# Patient Record
Sex: Female | Born: 1955 | Race: White | Hispanic: No | Marital: Married | Smoking: Current every day smoker
Health system: Southern US, Community
[De-identification: ages and names within clinical notes are randomized; demographics above are authoritative.]

## PROBLEM LIST (undated history)

## (undated) DIAGNOSIS — I2510 Atherosclerotic heart disease of native coronary artery without angina pectoris: Secondary | ICD-10-CM

## (undated) DIAGNOSIS — N12 Tubulo-interstitial nephritis, not specified as acute or chronic: Secondary | ICD-10-CM

## (undated) DIAGNOSIS — J449 Chronic obstructive pulmonary disease, unspecified: Secondary | ICD-10-CM

## (undated) HISTORY — PX: CORONARY ARTERY BYPASS GRAFT: SHX141

## (undated) HISTORY — PX: ABDOMINAL HYSTERECTOMY: SHX81

---

## 2012-07-20 ENCOUNTER — Encounter (HOSPITAL_COMMUNITY): Payer: Self-pay

## 2012-07-20 ENCOUNTER — Emergency Department (HOSPITAL_COMMUNITY)
Admission: EM | Admit: 2012-07-20 | Discharge: 2012-07-20 | Disposition: A | Discharge status: Home / Self Care | Payer: Medicare HMO | Attending: Emergency Medicine | Admitting: Emergency Medicine

## 2012-07-20 DIAGNOSIS — F17200 Nicotine dependence, unspecified, uncomplicated: Secondary | ICD-10-CM | POA: Insufficient documentation

## 2012-07-20 DIAGNOSIS — F419 Anxiety disorder, unspecified: Secondary | ICD-10-CM

## 2012-07-20 DIAGNOSIS — F323 Major depressive disorder, single episode, severe with psychotic features: Secondary | ICD-10-CM

## 2012-07-20 DIAGNOSIS — F411 Generalized anxiety disorder: Secondary | ICD-10-CM

## 2012-07-20 DIAGNOSIS — N12 Tubulo-interstitial nephritis, not specified as acute or chronic: Secondary | ICD-10-CM | POA: Insufficient documentation

## 2012-07-20 DIAGNOSIS — F329 Major depressive disorder, single episode, unspecified: Secondary | ICD-10-CM

## 2012-07-20 DIAGNOSIS — Z951 Presence of aortocoronary bypass graft: Secondary | ICD-10-CM | POA: Insufficient documentation

## 2012-07-20 DIAGNOSIS — Z8739 Personal history of other diseases of the musculoskeletal system and connective tissue: Secondary | ICD-10-CM | POA: Insufficient documentation

## 2012-07-20 DIAGNOSIS — I2510 Atherosclerotic heart disease of native coronary artery without angina pectoris: Secondary | ICD-10-CM | POA: Insufficient documentation

## 2012-07-20 DIAGNOSIS — Z87448 Personal history of other diseases of urinary system: Secondary | ICD-10-CM | POA: Insufficient documentation

## 2012-07-20 HISTORY — DX: Atherosclerotic heart disease of native coronary artery without angina pectoris: I25.10

## 2012-07-20 HISTORY — DX: Tubulo-interstitial nephritis, not specified as acute or chronic: N12

## 2012-07-20 LAB — URINALYSIS, ROUTINE W REFLEX MICROSCOPIC
Bilirubin Urine: NEGATIVE
Glucose, UA: NEGATIVE mg/dL
Ketones, ur: NEGATIVE mg/dL
Nitrite: NEGATIVE
Protein, ur: NEGATIVE mg/dL

## 2012-07-20 LAB — COMPREHENSIVE METABOLIC PANEL
ALT: 29 U/L (ref 0–35)
AST: 24 U/L (ref 0–37)
Albumin: 3.8 g/dL (ref 3.5–5.2)
Alkaline Phosphatase: 98 U/L (ref 39–117)
Potassium: 4.2 mEq/L (ref 3.5–5.1)
Sodium: 138 mEq/L (ref 135–145)
Total Protein: 7.3 g/dL (ref 6.0–8.3)

## 2012-07-20 LAB — RAPID URINE DRUG SCREEN, HOSP PERFORMED
Amphetamines: NOT DETECTED
Barbiturates: NOT DETECTED
Benzodiazepines: POSITIVE — AB
Cocaine: NOT DETECTED

## 2012-07-20 LAB — CBC WITH DIFFERENTIAL/PLATELET
Basophils Absolute: 0 10*3/uL (ref 0.0–0.1)
Basophils Relative: 1 % (ref 0–1)
Eosinophils Absolute: 0.1 10*3/uL (ref 0.0–0.7)
Lymphs Abs: 1.6 10*3/uL (ref 0.7–4.0)
MCH: 29 pg (ref 26.0–34.0)
MCHC: 32.7 g/dL (ref 30.0–36.0)
Neutrophils Relative %: 63 % (ref 43–77)
Platelets: 210 10*3/uL (ref 150–400)
RBC: 4.34 MIL/uL (ref 3.87–5.11)
RDW: 14.2 % (ref 11.5–15.5)

## 2012-07-20 MED ORDER — NICOTINE 21 MG/24HR TD PT24
21.0000 mg | MEDICATED_PATCH | Freq: Every day | TRANSDERMAL | Status: DC
  Administered 2012-07-20: 21 mg via TRANSDERMAL
  Filled 2012-07-20: qty 1

## 2012-07-20 MED ORDER — ALPRAZOLAM 0.5 MG PO TABS: 0.5000 mg | ORAL_TABLET | Freq: Three times a day (TID) | ORAL | Status: DC | PRN

## 2012-07-20 MED ORDER — ONDANSETRON HCL 4 MG PO TABS: 4.0000 mg | ORAL_TABLET | Freq: Three times a day (TID) | ORAL | Status: DC | PRN

## 2012-07-20 MED ORDER — ATORVASTATIN CALCIUM 40 MG PO TABS
40.0000 mg | ORAL_TABLET | Freq: Every day | ORAL | Status: DC
  Filled 2012-07-20: qty 1

## 2012-07-20 MED ORDER — HYDROCODONE-ACETAMINOPHEN 10-325 MG PO TABS: 1.0000 | ORAL_TABLET | Freq: Four times a day (QID) | ORAL | Status: DC | PRN

## 2012-07-20 MED ORDER — ALUM & MAG HYDROXIDE-SIMETH 200-200-20 MG/5ML PO SUSP: 30.0000 mL | ORAL | Status: DC | PRN

## 2012-07-20 MED ORDER — IBUPROFEN 600 MG PO TABS: 600.0000 mg | ORAL_TABLET | Freq: Three times a day (TID) | ORAL | Status: DC | PRN

## 2012-07-20 MED ORDER — ZOLPIDEM TARTRATE 10 MG PO TABS: 10.0000 mg | ORAL_TABLET | Freq: Every evening | ORAL | Status: DC | PRN

## 2012-07-20 MED ORDER — ALPRAZOLAM 1 MG PO TABS: 2.0000 mg | ORAL_TABLET | Freq: Three times a day (TID) | ORAL | Status: DC | PRN

## 2012-07-20 MED ORDER — ACETAMINOPHEN 325 MG PO TABS
650.0000 mg | ORAL_TABLET | ORAL | Status: DC | PRN
  Administered 2012-07-20: 650 mg via ORAL
  Filled 2012-07-20: qty 2

## 2012-07-20 MED ORDER — ZOLPIDEM TARTRATE 5 MG PO TABS: 5.0000 mg | ORAL_TABLET | Freq: Every evening | ORAL | Status: DC | PRN

## 2012-07-20 NOTE — BH Assessment (Signed)
[facility] Assessment Progress Note     Contacted Damoni Erker who is the husband.  His number is [phone].  He is en route to pick up pt.  Dr. Elsie Saas and Derwood Kaplan Ranking evaluated pt and will rescind paper work of IVC.  Pt will be recommended to follow up with primary care and psychiatric services in optx.  Husband confirmed pt has no psych admit hx and is not a danger to self or others based on hx.   Referral to optx resources provided.    Husband will pick up pt approximately 1900 when he gets in from [place].
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
## 2012-07-20 NOTE — BH Assessment (Signed)
[facility] Assessment Progress Note    Recommend ED get a Tele Psych ordered on pt and have her evaluated.  If needed, ACT will become involved for placement or follow up purposes.

## 2012-07-20 NOTE — ED Notes (Signed)
Her husband has phoned to tell us he is safe and sound and is at work as a Naval architect in [place].  He states he will attempt to call and speak with her in a couple of hours.  Her neighbor, Benjamin Stain has just phone to give her phone number [phone]) and to tell us she would be happy to give her a ride home.  She remains in no distress.

## 2012-07-20 NOTE — ED Notes (Signed)
telepsych info faxed and called 

## 2012-07-20 NOTE — ED Notes (Signed)
IVC papers faxed/magistrate called

## 2012-07-20 NOTE — ED Notes (Signed)
She has eaten breakfast without difficulty and remains in no distress.  She abruptly announced that she needed to leave, however, Dr. Lynelle Doctor determined that she needed to stay here for her safety. I.V.C. Papers were signed; and pt. Was then escorted (ambulatory) to psych. E.D. After I phoned report to [place], [place]

## 2012-07-20 NOTE — ED Notes (Signed)
Bed:WA21<BR> Expected date:<BR> Expected time:<BR> Means of arrival:<BR> Comments:<BR> EMS

## 2012-10-16 ENCOUNTER — Observation Stay (HOSPITAL_COMMUNITY)
Admission: EM | Admit: 2012-10-16 | Discharge: 2012-10-18 | Disposition: A | Discharge status: Home / Self Care | Payer: Medicare HMO | Attending: Internal Medicine | Admitting: Internal Medicine

## 2012-10-16 ENCOUNTER — Emergency Department (HOSPITAL_COMMUNITY): Payer: Medicare HMO

## 2012-10-16 ENCOUNTER — Encounter (HOSPITAL_COMMUNITY): Payer: Self-pay | Admitting: Neurology

## 2012-10-16 DIAGNOSIS — R4781 Slurred speech: Secondary | ICD-10-CM

## 2012-10-16 DIAGNOSIS — I6509 Occlusion and stenosis of unspecified vertebral artery: Secondary | ICD-10-CM | POA: Insufficient documentation

## 2012-10-16 DIAGNOSIS — J4489 Other specified chronic obstructive pulmonary disease: Secondary | ICD-10-CM | POA: Insufficient documentation

## 2012-10-16 DIAGNOSIS — W1809XA Striking against other object with subsequent fall, initial encounter: Secondary | ICD-10-CM | POA: Insufficient documentation

## 2012-10-16 DIAGNOSIS — R29898 Other symptoms and signs involving the musculoskeletal system: Secondary | ICD-10-CM | POA: Insufficient documentation

## 2012-10-16 DIAGNOSIS — W1800XA Striking against unspecified object with subsequent fall, initial encounter: Secondary | ICD-10-CM

## 2012-10-16 DIAGNOSIS — I771 Stricture of artery: Secondary | ICD-10-CM | POA: Insufficient documentation

## 2012-10-16 DIAGNOSIS — R4789 Other speech disturbances: Secondary | ICD-10-CM | POA: Insufficient documentation

## 2012-10-16 DIAGNOSIS — Z8673 Personal history of transient ischemic attack (TIA), and cerebral infarction without residual deficits: Secondary | ICD-10-CM | POA: Insufficient documentation

## 2012-10-16 DIAGNOSIS — R569 Unspecified convulsions: Secondary | ICD-10-CM

## 2012-10-16 DIAGNOSIS — Z8679 Personal history of other diseases of the circulatory system: Secondary | ICD-10-CM

## 2012-10-16 DIAGNOSIS — J449 Chronic obstructive pulmonary disease, unspecified: Secondary | ICD-10-CM | POA: Insufficient documentation

## 2012-10-16 DIAGNOSIS — R55 Syncope and collapse: Principal | ICD-10-CM | POA: Insufficient documentation

## 2012-10-16 LAB — POCT I-STAT, CHEM 8
Calcium, Ion: 1.12 mmol/L (ref 1.12–1.23)
Creatinine, Ser: 0.8 mg/dL (ref 0.50–1.10)
Glucose, Bld: 120 mg/dL — ABNORMAL HIGH (ref 70–99)
HCT: 40 % (ref 36.0–46.0)
Hemoglobin: 13.6 g/dL (ref 12.0–15.0)
Potassium: 3.9 mEq/L (ref 3.5–5.1)

## 2012-10-16 LAB — URINALYSIS, ROUTINE W REFLEX MICROSCOPIC
Bilirubin Urine: NEGATIVE
Hgb urine dipstick: NEGATIVE
Ketones, ur: NEGATIVE mg/dL
Nitrite: NEGATIVE
Protein, ur: NEGATIVE mg/dL
Specific Gravity, Urine: 1.016 (ref 1.005–1.030)
Urobilinogen, UA: 0.2 mg/dL (ref 0.0–1.0)

## 2012-10-16 LAB — RAPID URINE DRUG SCREEN, HOSP PERFORMED
Amphetamines: NOT DETECTED
Barbiturates: NOT DETECTED
Benzodiazepines: NOT DETECTED
Cocaine: NOT DETECTED
Opiates: NOT DETECTED
Tetrahydrocannabinol: NOT DETECTED

## 2012-10-16 LAB — COMPREHENSIVE METABOLIC PANEL
ALT: 30 U/L (ref 0–35)
AST: 23 U/L (ref 0–37)
Albumin: 3.8 g/dL (ref 3.5–5.2)
CO2: 20 mEq/L (ref 19–32)
Calcium: 9.4 mg/dL (ref 8.4–10.5)
Creatinine, Ser: 0.8 mg/dL (ref 0.50–1.10)
GFR calc non Af Amer: 79 mL/min — ABNORMAL LOW (ref >90)
Sodium: 133 mEq/L — ABNORMAL LOW (ref 135–145)
Total Protein: 7.4 g/dL (ref 6.0–8.3)

## 2012-10-16 LAB — CBC
MCHC: 33.6 g/dL (ref 30.0–36.0)
MCV: 90.4 fL (ref 78.0–100.0)
Platelets: 210 10*3/uL (ref 150–400)
RDW: 14.7 % (ref 11.5–15.5)
WBC: 7.5 10*3/uL (ref 4.0–10.5)

## 2012-10-16 LAB — POCT I-STAT TROPONIN I: Troponin i, poc: 0 ng/mL (ref 0.00–0.08)

## 2012-10-16 LAB — APTT: aPTT: 28 seconds (ref 24–37)

## 2012-10-16 LAB — DIFFERENTIAL
Basophils Absolute: 0 10*3/uL (ref 0.0–0.1)
Basophils Relative: 0 % (ref 0–1)
Eosinophils Relative: 3 % (ref 0–5)
Lymphocytes Relative: 20 % (ref 12–46)
Monocytes Absolute: 0.4 10*3/uL (ref 0.1–1.0)

## 2012-10-16 LAB — ETHANOL: Alcohol, Ethyl (B): <11 mg/dL (ref 0–11)

## 2012-10-16 LAB — PROTIME-INR: Prothrombin Time: 13 seconds (ref 11.6–15.2)

## 2012-10-16 MED ORDER — GADOBENATE DIMEGLUMINE 529 MG/ML IV SOLN
15.0000 mL | Freq: Once | INTRAVENOUS | Status: AC
  Administered 2012-10-16: 15 mL via INTRAVENOUS

## 2012-10-16 MED ORDER — ASPIRIN EC 81 MG PO TBEC
81.0000 mg | DELAYED_RELEASE_TABLET | Freq: Every day | ORAL | Status: DC
  Administered 2012-10-16 – 2012-10-18 (×3): 81 mg via ORAL
  Filled 2012-10-16 (×4): qty 1

## 2012-10-16 MED ORDER — ALPRAZOLAM 0.5 MG PO TABS: 0.5000 mg | ORAL_TABLET | Freq: Two times a day (BID) | ORAL | Status: DC | PRN

## 2012-10-16 MED ORDER — HEPARIN SODIUM (PORCINE) 5000 UNIT/ML IJ SOLN
5000.0000 [IU] | Freq: Three times a day (TID) | INTRAMUSCULAR | Status: DC
  Administered 2012-10-16 – 2012-10-18 (×5): 5000 [IU] via SUBCUTANEOUS
  Filled 2012-10-16 (×8): qty 1

## 2012-10-16 MED ORDER — VITAMIN B-1 100 MG PO TABS
100.0000 mg | ORAL_TABLET | Freq: Every day | ORAL | Status: DC
  Administered 2012-10-17 – 2012-10-18 (×2): 100 mg via ORAL
  Filled 2012-10-16 (×2): qty 1

## 2012-10-16 MED ORDER — ONDANSETRON HCL 4 MG PO TABS
4.0000 mg | ORAL_TABLET | Freq: Once | ORAL | Status: AC
  Administered 2012-10-16: 4 mg via ORAL
  Filled 2012-10-16: qty 1

## 2012-10-16 MED ORDER — SODIUM CHLORIDE 0.9 % IJ SOLN
3.0000 mL | Freq: Two times a day (BID) | INTRAMUSCULAR | Status: DC
  Administered 2012-10-16 – 2012-10-17 (×3): 3 mL via INTRAVENOUS

## 2012-10-16 MED ORDER — NICOTINE 14 MG/24HR TD PT24
14.0000 mg | MEDICATED_PATCH | TRANSDERMAL | Status: DC
  Administered 2012-10-16 – 2012-10-17 (×2): 14 mg via TRANSDERMAL
  Filled 2012-10-16 (×3): qty 1

## 2012-10-16 MED ORDER — ACETAMINOPHEN 500 MG PO TABS
500.0000 mg | ORAL_TABLET | Freq: Four times a day (QID) | ORAL | Status: DC | PRN
  Administered 2012-10-16 – 2012-10-17 (×3): 500 mg via ORAL
  Filled 2012-10-16 (×3): qty 1

## 2012-10-16 MED ORDER — FOLIC ACID 5 MG/ML IJ SOLN
1.0000 mg | Freq: Every day | INTRAMUSCULAR | Status: DC
  Administered 2012-10-16 – 2012-10-17 (×2): 1 mg via INTRAVENOUS
  Filled 2012-10-16 (×2): qty 0.2

## 2012-10-16 MED ORDER — ADULT MULTIVITAMIN W/MINERALS CH
1.0000 | ORAL_TABLET | Freq: Every day | ORAL | Status: DC
  Administered 2012-10-16 – 2012-10-18 (×3): 1 via ORAL
  Filled 2012-10-16 (×4): qty 1

## 2012-10-16 NOTE — Consult Note (Addendum)
Neurology Consultation Reason for Consult: Syncope Referring Physician: Rancour, S.  CC: Loss of consciousness  History is obtained from: EMS, patient  HPI: Ann [name] is a 57 y.o. female who was apparently last seen well about 8 AM and at that time she was her normal self per her husband via EMS. She then collapsed against the dresser, and EMS was called. On arrival, EMS noted some left-sided weakness and slurred speech and therefore a code stroke was activated. On arrival, her symptoms were rapidly improving. CT shows cerebellar infarct.  Of note, she has a history of "irregular heartbeat", but does not see a primary care physician here and has been off of her blood thinners for the past year.  EMS reports that she did have incontinence of urine. She also complains of headache.  LKW: 8 AM tpa given: no, mild symptoms    ROS: A 14 point ROS was performed and is negative except as noted in the HPI.  Past Medical History  Diagnosis Date  . Coronary artery disease   . Pyelonephritis   . Pyelonephritis 1    Family History: No hx similar  Social History: Tob: +smoker  Exam: Current vital signs: BP 119/51  Pulse 96  Resp 18  SpO2 95% Vital signs in last 24 hours: Pulse Rate:  [96] 96 (09/04 0920) Resp:  [18] 18 (09/04 0920) BP: (119)/(51) 119/51 mmHg (09/04 0920) SpO2:  [90 %-95 %] 95 % (09/04 0922)  General: In bed, NAD CV: Regular rate and Mental Status: Patient is awake, alert, oriented to person, place, month, year, and situation. She has some impaired memory of this morning in the events of last night Patient is able to give a clear and coherent history. No signs of aphasia or neglect Cranial Nerves: II: Visual Fields are full. Pupils are equal, round, and reactive to light.  Discs are without papilledema. III,IV, VI: EOMI without ptosis or diploplia.  V: Facial sensation is symmetric to temperature VII: Facial movement is symmetric.  VIII: hearing is intact  to voice X: Uvula elevates symmetrically XI: Shoulder shrug is symmetric. XII: tongue is midline without atrophy or fasciculations.  Motor: Tone is normal. Bulk is normal. 5/5 strength was present on the right, on the left she initially did display some hesitancy in movement, but to confrontation she appeared to have full strength and did not have any drift. Sensory: Sensation is symmetric to light touch and pin in the arms and legs. Deep Tendon Reflexes: 2+ and symmetric in the biceps and patellae.  Plantars: Toes are downgoing bilaterally.  Cerebellar: FNF and HKS are intact bilaterally, though initially she had some difficulty on her left with concentration she was able to perform it well. Gait: Slightly unsteady, but is able to walk   I have reviewed labs in epic and the results pertinent to this consultation are: chem 8-unremarkable  I have reviewed the images obtained: CT head cerebellar stroke  Impression: 57 year old female with sudden onset loss of consciousness followed by left-sided weakness. Given her cerebellar infarct, there may be concerned for posterior circulation TIA but I'm also concerned that this possibly could have been a seizure. An MRI could be helpful it shows ischemia or a clear seizure focus.  Recommendations: 1) MRI brain 2) will need to get further history from the husband once she arrives, my attempts to contact him so far have failed.  Ritta Slot, MD [facility] [phone]  If 7pm- 7am, please page neurology on call at [phone].  Spoke with husband, patient had no complete LOC, but became lightheaded and fel into dresser. He was not sure of focal weakness. Started talking more after about 2-3 minutes and then progressively improved.   Syncope of unclear etiology.  1) Will order EEG   Ritta Slot, MD [facility] [phone]  If 7pm- 7am, please page neurology on call at [phone].

## 2012-10-16 NOTE — ED Notes (Signed)
Admitting MD at bedside.

## 2012-10-16 NOTE — ED Notes (Signed)
Pt was brought back here after chest xray, still needs MRI.

## 2012-10-16 NOTE — ED Notes (Signed)
Called to order patient a regular meal tray.

## 2012-10-16 NOTE — ED Notes (Signed)
Report called to tracy on [address]

## 2012-10-16 NOTE — ED Notes (Signed)
Updated the patient on inpatient room assignment delay. Will move the patient to POD C to await for bed. Patient verbalized understanding and agrees with plan. Care transferred and report given to [place], [place]

## 2012-10-16 NOTE — Progress Notes (Signed)
Spoke with Neurology Dr. Amada Jupiter who would like to add MRA head WO contrast and MRA neck W/WO contrast. I have discussed with the ED physician about above recs from Neurology.   Dede Query, MD PGY-3 IMTS 10:24 AM

## 2012-10-16 NOTE — ED Notes (Signed)
Pt in MRI will not obtain VS.

## 2012-10-16 NOTE — ED Notes (Signed)
Pt reporting h/a , paged admitting MD for pain med order.

## 2012-10-16 NOTE — H&P (Signed)
Date: 10/16/2012               Patient Name:  Ann [name] MRN: [id number]  DOB: 02/04/1956 Age / Sex: 58 y.o., female   PCP: Pcp Not In System         Medical Service: Internal Medicine Teaching Service         Attending Physician: Dr. Rocco Serene, MD    First Contact: Dr. Vivi Barrack Pager: [phone]  Second Contact: Dr. Lennice Sites Pager: [phone]       After Hours (After 5p/  First Contact Pager: [phone]  weekends / holidays): Second Contact Pager: [phone]   Chief Complaint: Fall and slurred speech  History of Present Illness:  Mrs. Ann [name] is a 57 y.o. woman PMH COPD, benign cardiac tumor, anxiety disorder who presents with history of a fall and slurred speech.  The patient was in her normal state of health until 8am this morning, making coffee in the kitchen, when she collapsed. Her husband was with her and able to catch her so that she did not hit her head. She did however fall onto a piece of furniture, hitting the left side of her chest. The husband states she was responsive to him the whole time, so he does not think she lost consciousness. However that patient herself remembers nothing of the event, nor the EMS ride into the hospital. Patient and husband deny aura, palpitations, chest pain, diaphoresis, confusion before she fell. After she fell, no bowel or bladder incontinence. No jerking movements of arms and legs. No tongue biting. Per first responders she had left sided weakness, disorientation and dysarthria at the scene. On the ride in, EMS reported normal grip strength, but persistent disorientation and dysarthria, though improving. Currently the patient is alert and oriented and denies weakness, numbness, dysarthria, word-finding difficulties. This has never happened to her before.  She is on multiple sedating medications including Xanax and Norco. She says she takes 2-3 Xanax daily, amitriptyline about once a week at night "when I feel like it". She states she has a  prescription for Norco but only takes it very rarely, with no use in the past few weeks. She brings 3 medicine bottles with her. One is for Xanax 1mg  TID, #90, dispensed on 09/26/12, with zero pills left. Another is for amitriptyline 50 qd, the dispense date was in 2013. She also brings a bottle of Norco 7.5-325 once daily prn, #30, with ~10 pills left, dispense date in April of this year.   We ran an inquiry on her into the narcotic database: - On 09/26/12 she filled xanax 1mg  #90 and oxycodone-acetaminophen 10-325 #30.  - On 09/01/12 she filled xanax 2mg  #90 and hydrocodone-acetaminophen 10-325 #30.  - On 08/04/12 she filled xanax 2mg  #90 and hydrocodone-acetaminophen 10-325 #30.  - On 07/06/12 she filled xanax 2mg  #90 and hydrocodone-acetaminophen 10-325 #30.   She gets her medications from Dr. Allegra Lai in [place], [place], [phone]. Her husband owns a trucking company and they split their time between [place]. They travel a lot. She and her husband just returned from a road trip last night, getting in at 3am.  She has smoked 1PPD since she was a girl. Denies alcohol or drug use.   Meds: Current Facility-Administered Medications  Medication Dose Route Frequency Provider Last Rate Last Dose  . acetaminophen (TYLENOL) tablet 500 mg  500 mg Oral Q6H PRN Judie Bonus, MD   500 mg at 10/16/12 1430  Current Outpatient Prescriptions  Medication Sig Dispense Refill  . acetaminophen (TYLENOL) 500 MG tablet Take 500 mg by mouth every 6 (six) hours as needed for pain.      Marland Kitchen ALPRAZolam (XANAX) 1 MG tablet Take 1 mg by mouth daily as needed for sleep (anxiety).      Marland Kitchen atorvastatin (LIPITOR) 40 MG tablet Take 40 mg by mouth at bedtime.      Marland Kitchen HYDROcodone-acetaminophen (NORCO) 10-325 MG per tablet Take 1 tablet by mouth every 6 (six) hours as needed for pain.      . Multiple Vitamin (MULTIVITAMIN WITH MINERALS) TABS Take 1 tablet by mouth daily.      . ondansetron (ZOFRAN) 8 MG tablet  Take 8 mg by mouth every 8 (eight) hours as needed for nausea (nausea).        Allergies: Allergies as of 10/16/2012 - Review Complete 10/16/2012  Allergen Reaction Noted  . Advil [ibuprofen]  10/16/2012  . Codeine Nausea And Vomiting 07/20/2012   Past Medical History  Diagnosis Date  . Coronary artery disease   . Pyelonephritis   . Pyelonephritis 1   Past Surgical History  Procedure Laterality Date  . Abdominal hysterectomy    . Coronary artery bypass graft  1   No family history on file. History   Social History  . Marital Status: Married    Spouse Name: N/A    Number of Children: N/A  . Years of Education: N/A   Occupational History  . Not on file.   Social History Main Topics  . Smoking status: Current Every Day Smoker    Types: Cigarettes  . Smokeless tobacco: Not on file  . Alcohol Use: No  . Drug Use: Not on file  . Sexual Activity: Not on file   Other Topics Concern  . Not on file   Social History Narrative  . No narrative on file    Review of Systems: Pertinent items are noted in HPI.  Physical Exam: Blood pressure 120/49, pulse 88, resp. rate 16, SpO2 94.00%. Physical Exam  Constitutional: She is oriented to person, place, and time and well-developed, well-nourished, and in no distress.  HENT:  Head: Normocephalic and atraumatic.  Eyes: Conjunctivae and EOM are normal. Pupils are equal, round, and reactive to light.  Neck: Normal range of motion. Neck supple.  Cardiovascular: Normal rate, regular rhythm, normal heart sounds and intact distal pulses.  Exam reveals no gallop and no friction rub.   No murmur heard. No LE edema.  Pulmonary/Chest: Effort normal and breath sounds normal. No respiratory distress. She has no wheezes. She has no rales. She exhibits no tenderness.  Abdominal: Soft. Bowel sounds are normal. She exhibits no distension and no mass. There is no tenderness. There is no rebound and no guarding.  Musculoskeletal: Normal range  of motion. She exhibits tenderness (Mildly tender left side from flank to shoulder, reproducible on palpation, no current bruising, no radiation). She exhibits no edema.  No calf tenderness.  Neurological: She is alert and oriented to person, place, and time. She displays facial symmetry and normal speech. No cranial nerve deficit or sensory deficit (Sensation intact to sensation and light touch bilaterally). Coordination normal. GCS score is 15. She displays no Babinski's sign on the right side. She displays no Babinski's sign on the left side.  Reflex Scores:      Brachioradialis reflexes are 2+ on the right side and 2+ on the left side.      Patellar reflexes are  2+ on the right side and 2+ on the left side. Muscular strength: RUE- 5+ RLE- 5+ LUE- 5 LLE - 5 Gait exam deferred.  Skin: Skin is warm and dry. No rash noted. She is not diaphoretic.  Psychiatric: Mood and affect normal.     Lab results: Basic Metabolic Panel:  Recent Labs  16/10/96 0900 10/16/12 0917  NA 133* 137  K 3.9 3.9  CL 98 104  CO2 20  --   GLUCOSE 122* 120*  BUN 15 14  CREATININE 0.80 0.80  CALCIUM 9.4  --    Liver Function Tests:  Recent Labs  10/16/12 0900  AST 23  ALT 30  ALKPHOS 150*  BILITOT 0.3  PROT 7.4  ALBUMIN 3.8   CBC:  Recent Labs  10/16/12 0900 10/16/12 0917  WBC 7.5  --   NEUTROABS 5.3  --   HGB 12.9 13.6  HCT 38.4 40.0  MCV 90.4  --   PLT 210  --   Diff wnl  Cardiac Enzymes:  Recent Labs  10/16/12 0908  TROPONINI <0.30  POC troponin also negative  CBG:  Recent Labs  10/16/12 0916  GLUCAP 125*   Coagulation:  Recent Labs  10/16/12 0900  LABPROT 13.0  INR 1.00   Urine Drug Screen: Drugs of Abuse     Component Value Date/Time   LABOPIA NONE DETECTED 10/16/2012 1404   COCAINSCRNUR NONE DETECTED 10/16/2012 1404   LABBENZ NONE DETECTED 10/16/2012 1404   AMPHETMU NONE DETECTED 10/16/2012 1404   THCU NONE DETECTED 10/16/2012 1404   LABBARB NONE DETECTED  10/16/2012 1404    Alcohol Level:  Recent Labs  10/16/12 0900  ETH <11   Urinalysis:  Recent Labs  10/16/12 1404  COLORURINE YELLOW  LABSPEC 1.016  PHURINE 7.5  GLUCOSEU NEGATIVE  HGBUR NEGATIVE  BILIRUBINUR NEGATIVE  KETONESUR NEGATIVE  PROTEINUR NEGATIVE  UROBILINOGEN 0.2  NITRITE NEGATIVE  LEUKOCYTESUR NEGATIVE    Imaging results:  Dg Chest 2 View  10/16/2012   *RADIOLOGY REPORT*  Clinical Data: , stroke  CHEST - 2 VIEW  Comparison: None.  Findings: The heart and pulmonary vascularity are within normal limits.  Mild interstitial changes are noted bilaterally without focal infiltrate.  Radiopaque leads are noted over the chest.  IMPRESSION: No acute abnormality is noted.   Original Report Authenticated By: Alcide Clever, M.D.   Ct Head Wo Contrast  10/16/2012   **ADDENDUM** CREATED: 10/16/2012 09:28:58  These results were called by telephone on October 16, 2012 at 09:24 a.m. to Dr. Petra Kuba, who verbally acknowledged these results.  **END ADDENDUM** SIGNED BY: Rutherford Guys. Margarita Grizzle, M.D.  10/16/2012   *RADIOLOGY REPORT*  Clinical Data: Left-sided weakness; slurring of speech  CT HEAD WITHOUT CONTRAST  Technique:  Contiguous axial images were obtained from the base of the skull through the vertex without contrast. Study was obtained within 24 hours of patient arrival at the emergency department  Comparison: None.  Findings:  There is age-related volume loss.  There is an area of decreased attenuation in the posterior mid right cerebellum measuring 1.7 x 1.4 cm, suspicious for recent infarct.  There is no other evidence suggesting acute infarct.  There is patchy small vessel disease in the centra semiovale bilaterally, more on the left than on the right.  There is no appreciable mass effect, hemorrhage, extra-axial fluid collection, midline shift.  Bony calvarium appears intact.  The mastoid air cells are clear.  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  There is age-related volume loss with patchy supratentorial small vessel disease.  No hemorrhage or appreciable mass effect.  Original Report Authenticated By: Bretta Bang, M.D.   Mr [facility] Wo Contrast  10/16/2012   *RADIOLOGY REPORT*  Clinical Data:  Left-sided weakness and slurred speech.  Stroke. Abnormal CT of the head.  MRI HEAD WITHOUT AND WITH CONTRAST MRA HEAD WITHOUT CONTRAST MRA NECK WITHOUT AND WITH CONTRAST  Technique:  Multiplanar, multiecho pulse sequences of the brain and surrounding structures were obtained without and with intravenous contrast.  Angiographic images of the Circle of Willis were obtained using MRA technique without intravenous contrast. Angiographic images of the neck were obtained using MRA technique without and with intravenous contrast.  Carotid stenosis measurements (when applicable) are obtained utilizing NASCET criteria, using the distal internal carotid diameter as the denominator.  Contrast: 15mL MULTIHANCE GADOBENATE DIMEGLUMINE 529 MG/ML IV SOLN  Comparison:  CT head without contrast 10/16/2012.  MRI HEAD  Findings:   The diffusion weighted images demonstrate no evidence for acute or subacute infarction.  Focal prominence of the sulci accounts for the abnormal CT finding.  This may be related to a remote infarct.  There is a similar, slightly less prominent, area on the left.  Periventricular white matter changes are more prominent left than right.  These are advanced for age.  No significant white matter changes are evident within the brain stem. Flow is present in the major intracranial arteries.  The globes orbits are intact.  The paranasal sinuses and mastoid air cells are clear.  The postcontrast images demonstrate no focal areas of pathologic enhancement.  IMPRESSION: 1.  Focal prominence of the sulci may be related to remote ischemia in the cerebellum bilaterally, right greater than left.  This accounts for the CT finding. 2.  No acute  intracranial abnormality. 3.  Age advanced periventricular white matter changes bilaterally. The finding is nonspecific but can be seen in the setting of chronic microvascular ischemia, a demyelinating process such as multiple sclerosis, vasculitis, complicated migraine headaches, or as the sequelae of a prior infectious or inflammatory process.  MRA HEAD  Findings: The internal carotid arteries are within normal limits from high cervical segments through the ICA termini bilaterally. The A1 and M1 segments are normal.  The anterior communicating artery is patent.  The MCA bifurcations are within normal limits. There is mild irregularity of distal small vessels bilaterally.  No significant proximal stenosis or occlusion is evident. An infundibulum is evident at the left posterior communicating artery.  The left vertebral artery is the dominant vessel.  The right vertebral artery essentially terminates at the PICA.  The basilar artery is within normal limits.  The right posterior cerebral artery is of fetal type.  A smaller left posterior communicating artery is present.  The PCA branch vessels are within normal limits bilaterally.  IMPRESSION:  1.  Mild distal small vessel disease within the anterior circulation.  This corresponds to the age advanced white matter disease. 2.  Normal variant circulation without other significant proximal stenosis, aneurysm, or branch vessel occlusion.  MRA NECK  Findings: Time-of-flight images demonstrate no significant flow disturbance at either carotid bifurcation.  The postcontrast images demonstrate a standard three-vessel arch configuration.  Both vertebral arteries originate from the subclavian arteries.  There is a moderate stenosis in the proximal left vertebral artery, the dominant vessel.  No significant stenoses are present in the remainder of the vertebral arteries. There is a mild to moderate  stenosis of the left subclavian artery beyond the vertebral artery.  The  innominate artery and right common carotid artery are within normal limits.  The bifurcation is unremarkable.  The right internal carotid artery is normal.  There is slight signal loss in the proximal left vertebral common carotid artery which is likely artifactual.  The bifurcation is within normal limits.  The left internal carotid artery is normal.  IMPRESSION:  1.  Moderate proximal left vertebral artery stenosis, the dominant vessel, without additional stenoses. 2.  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 3.  Normal appearance of the anterior circulation appear   Original Report Authenticated By: Marin Roberts, M.D.   Mr Angiogram Neck W Wo Contrast  10/16/2012   *RADIOLOGY REPORT*  Clinical Data:  Left-sided weakness and slurred speech.  Stroke. Abnormal CT of the head.  MRI HEAD WITHOUT AND WITH CONTRAST MRA HEAD WITHOUT CONTRAST MRA NECK WITHOUT AND WITH CONTRAST  Technique:  Multiplanar, multiecho pulse sequences of the brain and surrounding structures were obtained without and with intravenous contrast.  Angiographic images of the Circle of Willis were obtained using MRA technique without intravenous contrast. Angiographic images of the neck were obtained using MRA technique without and with intravenous contrast.  Carotid stenosis measurements (when applicable) are obtained utilizing NASCET criteria, using the distal internal carotid diameter as the denominator.  Contrast: 15mL MULTIHANCE GADOBENATE DIMEGLUMINE 529 MG/ML IV SOLN  Comparison:  CT head without contrast 10/16/2012.  MRI HEAD  Findings:   The diffusion weighted images demonstrate no evidence for acute or subacute infarction.  Focal prominence of the sulci accounts for the abnormal CT finding.  This may be related to a remote infarct.  There is a similar, slightly less prominent, area on the left.  Periventricular white matter changes are more prominent left than right.  These are advanced for  age.  No significant white matter changes are evident within the brain stem. Flow is present in the major intracranial arteries.  The globes orbits are intact.  The paranasal sinuses and mastoid air cells are clear.  The postcontrast images demonstrate no focal areas of pathologic enhancement.  IMPRESSION: 1.  Focal prominence of the sulci may be related to remote ischemia in the cerebellum bilaterally, right greater than left.  This accounts for the CT finding. 2.  No acute intracranial abnormality. 3.  Age advanced periventricular white matter changes bilaterally. The finding is nonspecific but can be seen in the setting of chronic microvascular ischemia, a demyelinating process such as multiple sclerosis, vasculitis, complicated migraine headaches, or as the sequelae of a prior infectious or inflammatory process.  MRA HEAD  Findings: The internal carotid arteries are within normal limits from high cervical segments through the ICA termini bilaterally. The A1 and M1 segments are normal.  The anterior communicating artery is patent.  The MCA bifurcations are within normal limits. There is mild irregularity of distal small vessels bilaterally.  No significant proximal stenosis or occlusion is evident. An infundibulum is evident at the left posterior communicating artery.  The left vertebral artery is the dominant vessel.  The right vertebral artery essentially terminates at the PICA.  The basilar artery is within normal limits.  The right posterior cerebral artery is of fetal type.  A smaller left posterior communicating artery is present.  The PCA branch vessels are within normal limits bilaterally.  IMPRESSION:  1.  Mild distal small vessel disease within the anterior circulation.  This corresponds to  the age advanced white matter disease. 2.  Normal variant circulation without other significant proximal stenosis, aneurysm, or branch vessel occlusion.  MRA NECK  Findings: Time-of-flight images demonstrate no  significant flow disturbance at either carotid bifurcation.  The postcontrast images demonstrate a standard three-vessel arch configuration.  Both vertebral arteries originate from the subclavian arteries.  There is a moderate stenosis in the proximal left vertebral artery, the dominant vessel.  No significant stenoses are present in the remainder of the vertebral arteries. There is a mild to moderate stenosis of the left subclavian artery beyond the vertebral artery.  The innominate artery and right common carotid artery are within normal limits.  The bifurcation is unremarkable.  The right internal carotid artery is normal.  There is slight signal loss in the proximal left vertebral common carotid artery which is likely artifactual.  The bifurcation is within normal limits.  The left internal carotid artery is normal.  IMPRESSION:  1.  Moderate proximal left vertebral artery stenosis, the dominant vessel, without additional stenoses. 2.  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 3.  Normal appearance of the anterior circulation appear   Original Report Authenticated By: Marin Roberts, M.D.   Mr [facility] Wo Contrast  10/16/2012   *RADIOLOGY REPORT*  Clinical Data:  Left-sided weakness and slurred speech.  Stroke. Abnormal CT of the head.  MRI HEAD WITHOUT AND WITH CONTRAST MRA HEAD WITHOUT CONTRAST MRA NECK WITHOUT AND WITH CONTRAST  Technique:  Multiplanar, multiecho pulse sequences of the brain and surrounding structures were obtained without and with intravenous contrast.  Angiographic images of the Circle of Willis were obtained using MRA technique without intravenous contrast. Angiographic images of the neck were obtained using MRA technique without and with intravenous contrast.  Carotid stenosis measurements (when applicable) are obtained utilizing NASCET criteria, using the distal internal carotid diameter as the denominator.  Contrast: 15mL MULTIHANCE  GADOBENATE DIMEGLUMINE 529 MG/ML IV SOLN  Comparison:  CT head without contrast 10/16/2012.  MRI HEAD  Findings:   The diffusion weighted images demonstrate no evidence for acute or subacute infarction.  Focal prominence of the sulci accounts for the abnormal CT finding.  This may be related to a remote infarct.  There is a similar, slightly less prominent, area on the left.  Periventricular white matter changes are more prominent left than right.  These are advanced for age.  No significant white matter changes are evident within the brain stem. Flow is present in the major intracranial arteries.  The globes orbits are intact.  The paranasal sinuses and mastoid air cells are clear.  The postcontrast images demonstrate no focal areas of pathologic enhancement.  IMPRESSION: 1.  Focal prominence of the sulci may be related to remote ischemia in the cerebellum bilaterally, right greater than left.  This accounts for the CT finding. 2.  No acute intracranial abnormality. 3.  Age advanced periventricular white matter changes bilaterally. The finding is nonspecific but can be seen in the setting of chronic microvascular ischemia, a demyelinating process such as multiple sclerosis, vasculitis, complicated migraine headaches, or as the sequelae of a prior infectious or inflammatory process.  MRA HEAD  Findings: The internal carotid arteries are within normal limits from high cervical segments through the ICA termini bilaterally. The A1 and M1 segments are normal.  The anterior communicating artery is patent.  The MCA bifurcations are within normal limits. There is mild irregularity of distal small vessels bilaterally.  No significant proximal  stenosis or occlusion is evident. An infundibulum is evident at the left posterior communicating artery.  The left vertebral artery is the dominant vessel.  The right vertebral artery essentially terminates at the PICA.  The basilar artery is within normal limits.  The right posterior  cerebral artery is of fetal type.  A smaller left posterior communicating artery is present.  The PCA branch vessels are within normal limits bilaterally.  IMPRESSION:  1.  Mild distal small vessel disease within the anterior circulation.  This corresponds to the age advanced white matter disease. 2.  Normal variant circulation without other significant proximal stenosis, aneurysm, or branch vessel occlusion.  MRA NECK  Findings: Time-of-flight images demonstrate no significant flow disturbance at either carotid bifurcation.  The postcontrast images demonstrate a standard three-vessel arch configuration.  Both vertebral arteries originate from the subclavian arteries.  There is a moderate stenosis in the proximal left vertebral artery, the dominant vessel.  No significant stenoses are present in the remainder of the vertebral arteries. There is a mild to moderate stenosis of the left subclavian artery beyond the vertebral artery.  The innominate artery and right common carotid artery are within normal limits.  The bifurcation is unremarkable.  The right internal carotid artery is normal.  There is slight signal loss in the proximal left vertebral common carotid artery which is likely artifactual.  The bifurcation is within normal limits.  The left internal carotid artery is normal.  IMPRESSION:  1.  Moderate proximal left vertebral artery stenosis, the dominant vessel, without additional stenoses. 2.  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 3.  Normal appearance of the anterior circulation appear   Original Report Authenticated By: Marin Roberts, M.D.    Other results: EKG: NSR, rate 96, no concerning ST/T wave changes.  Assessment & Plan by Problem: Mrs. Ann [name] is a 57 y.o. woman PMH COPD, benign cardiac tumor, anxiety disorder who presents with history of a fall and slurred speech.  #History of slurred speech, left sided weakness, disorientation - Now  resolved. Neuro exam unremarkable except for very mild left-sided weakness. History concerning for stroke vs. TIA. Head CT suggested acute infarct in the posterior mid right cerebellum; however MRI revealed this instead to be a focal prominence of the sulci, perhaps related to remote ischemia in the cerebellum bilaterally, right greater than left, no acute intracranial abnormality. MRA unremarkable. We will therefore perform TIA workup. 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 Her UDS is negative. However, patient reports only taking her Xanax 2-3 times a day, but her #90 supply is gone after only 20 days. She also brings an old bottle of Norco and reports rarely taking it, but she filled a prescription for oxycodone-tylenol as recently as 09/26/12. Lower on the differential is seizure with post-ictal phenomena including Todd's paralysis. However, Todd's paralysis usually follows generalized or complex partial seizures, and the description of her fall does not suggest this. Likewise, Todd's paralysis is most often related to a structural abnormality of the brain, and her MRI does not show mesial temporal or hippocampal sclerosis. - Admit to IMTS - Passed beside swallowing study, initiated regular diet - 2D echo - Carotid dopplers - Checking A1C, lipid panel - ASA daily - Cardiac telemetry to r/o atrial fibrillation - Will continue home xanax at 0.5mg  bid - Will call PCP tomorrow to clarify medication indications and use, let him know of our concerns - PT/OT consult  #Fall against object - Causing  left sided soreness, reproducable on palpation. Troponin negative. Husband describes fall into furniture in that exact area. - Tylenol prn pain  #COPD - Has not used inhalers in months. Not on home O2. Currently everyday smoker. - Continue to monitor, will prescribe duonebs if needed  #DVT PPX - subq heparin   Dispo: Disposition is deferred at this time, awaiting improvement of current  medical problems. Anticipated discharge in approximately 1-3 day(s).   The patient does have a current PCP (Dr. Allegra Lai) and does need an [facility] hospital follow-up appointment after discharge.  The patient does not have transportation limitations that hinder transportation to clinic appointments.  Signed: Vivi Barrack, MD 10/16/2012, 4:08 PM

## 2012-10-16 NOTE — ED Provider Notes (Signed)
CSN: [id number]     Arrival date & time 10/16/12  0857 History   First MD Initiated Contact with Patient 10/16/12 [phone]     Chief Complaint  Patient presents with  . 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  She was reportedly normal this morning at 8a, making coffee, when she fell into a dresser.  Her husband caught her and lowered her to the ground.  No head trauma.  Fire reported left sided weakness, disorientation and dysarthria.  EMS reported normal grip strength, but persistent disorientation and dysarthria, although improving.  She also has retrograde amnesia for events that occurred yesterday.  She was able to tell me her name on arrival.  Patient is a 57 y.o. female presenting with neurologic complaint. The history is provided by the patient.  Neurologic Problem This is a new problem. The current episode started today. The problem occurs constantly. The problem has been gradually improving. She has tried nothing for the symptoms.    Past Medical History  Diagnosis Date  . Coronary artery disease   . Pyelonephritis   . Pyelonephritis 1   Past Surgical History  Procedure Laterality Date  . Abdominal hysterectomy    . Coronary artery bypass graft  1   No family history on file. History  Substance Use Topics  . Smoking status: Current Every Day Smoker    Types: Cigarettes  . Smokeless tobacco: Not on file  . Alcohol Use: No   OB History   Grav Para Term Preterm Abortions TAB SAB Ect Mult Living                 Review of Systems  Unable to perform ROS: Acuity of condition    Allergies  Codeine  Home Medications   Current Outpatient Rx  Name  Route  Sig  Dispense  Refill  . acetaminophen (TYLENOL) 500 MG tablet   Oral   Take 500 mg by  mouth every 6 (six) hours as needed for pain.         Marland Kitchen alprazolam (XANAX) 2 MG tablet   Oral   Take 2 mg by mouth every 4 (four) hours as needed for anxiety.         Marland Kitchen atorvastatin (LIPITOR) 40 MG tablet   Oral   Take 40 mg by mouth at bedtime.         Marland Kitchen HYDROcodone-acetaminophen (NORCO) 10-325 MG per tablet   Oral   Take 1 tablet by mouth every 6 (six) hours as needed for pain.         . Multiple Vitamin (MULTIVITAMIN WITH MINERALS) TABS   Oral   Take 1 tablet by mouth daily.          BP 117/63  Pulse 90  Resp 17  SpO2 96% Physical Exam  Vitals reviewed. Constitutional: She appears well-developed and well-nourished. No distress.  HENT:  Right Ear: External ear normal.  Left Ear: External ear normal.  Mouth/Throat: No oropharyngeal exudate.  Eyes: Conjunctivae and EOM are normal. Pupils are equal, round, and reactive to light.  Neck: Normal range of motion. Neck supple.  Cardiovascular: Normal rate, regular rhythm, normal heart sounds and intact distal pulses.  Exam reveals no gallop and no friction rub.   No murmur heard. Pulmonary/Chest: Effort normal and breath sounds  normal.  Abdominal: Soft. Bowel sounds are normal. She exhibits no distension. There is no tenderness.  Musculoskeletal: Normal range of motion. She exhibits no edema.  Neurological: She is alert. She has normal strength and normal reflexes. No cranial nerve deficit or sensory deficit. Coordination normal.  Oriented to name, confused with higher level questioning Dysarthria No truncal ataxia Normal finger to nose and heel to shin  Skin: Skin is warm and dry. No rash noted. She is not diaphoretic.  Psychiatric: She has a normal mood and affect.    ED Course  Procedures (including critical care time) Labs Review Labs Reviewed  COMPREHENSIVE METABOLIC PANEL - Abnormal; Notable for the following:    Sodium 133 (*)    Glucose, Bld 122 (*)    Alkaline Phosphatase 150 (*)    GFR calc non Af  Amer 79 (*)    All other components within normal limits  GLUCOSE, CAPILLARY - Abnormal; Notable for the following:    Glucose-Capillary 125 (*)    All other components within normal limits  POCT I-STAT, CHEM 8 - Abnormal; Notable for the following:    Glucose, Bld 120 (*)    All other components within normal limits  ETHANOL  PROTIME-INR  APTT  CBC  DIFFERENTIAL  TROPONIN I  URINE RAPID DRUG SCREEN (HOSP PERFORMED)  URINALYSIS, ROUTINE W REFLEX MICROSCOPIC  POCT I-STAT TROPONIN I   Imaging Review Dg Chest 2 View  10/16/2012   *RADIOLOGY REPORT*  Clinical Data: , stroke  CHEST - 2 VIEW  Comparison: None.  Findings: The heart and pulmonary vascularity are within normal limits.  Mild interstitial changes are noted bilaterally without focal infiltrate.  Radiopaque leads are noted over the chest.  IMPRESSION: No acute abnormality is noted.   Original Report Authenticated By: Alcide Clever, M.D.   Ct Head Wo Contrast  10/16/2012   **ADDENDUM** CREATED: 10/16/2012 09:28:58  These results were called by telephone on October 16, 2012 at 09:24 a.m. to Dr. Petra Kuba, who verbally acknowledged these results.  **END ADDENDUM** SIGNED BY: Rutherford Guys. Margarita Grizzle, M.D.  10/16/2012   *RADIOLOGY REPORT*  Clinical Data: Left-sided weakness; slurring of speech  CT HEAD WITHOUT CONTRAST  Technique:  Contiguous axial images were obtained from the base of the skull through the vertex without contrast. Study was obtained within 24 hours of patient arrival at the emergency department  Comparison: None.  Findings:  There is age-related volume loss.  There is an area of decreased attenuation in the posterior mid right cerebellum measuring 1.7 x 1.4 cm, suspicious for recent infarct.  There is no other evidence suggesting acute infarct.  There is patchy small vessel disease in the centra semiovale bilaterally, more on the left than on the right.  There is no appreciable mass effect, hemorrhage, extra-axial fluid collection,  midline shift.  Bony calvarium appears intact.  The mastoid air cells are clear.  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  There is age-related volume loss with patchy supratentorial small vessel disease.  No hemorrhage or appreciable mass effect.  Original Report Authenticated By: Bretta Bang, M.D.   Mr [facility] Wo Contrast  10/16/2012   *RADIOLOGY REPORT*  Clinical Data:  Left-sided weakness and slurred speech.  Stroke. Abnormal CT of the head.  MRI HEAD WITHOUT AND WITH CONTRAST MRA HEAD WITHOUT CONTRAST MRA NECK WITHOUT AND WITH CONTRAST  Technique:  Multiplanar, multiecho pulse sequences of the brain and surrounding structures were obtained without and with  intravenous contrast.  Angiographic images of the Circle of Willis were obtained using MRA technique without intravenous contrast. Angiographic images of the neck were obtained using MRA technique without and with intravenous contrast.  Carotid stenosis measurements (when applicable) are obtained utilizing NASCET criteria, using the distal internal carotid diameter as the denominator.  Contrast: 15mL MULTIHANCE GADOBENATE DIMEGLUMINE 529 MG/ML IV SOLN  Comparison:  CT head without contrast 10/16/2012.  MRI HEAD  Findings:   The diffusion weighted images demonstrate no evidence for acute or subacute infarction.  Focal prominence of the sulci accounts for the abnormal CT finding.  This may be related to a remote infarct.  There is a similar, slightly less prominent, area on the left.  Periventricular white matter changes are more prominent left than right.  These are advanced for age.  No significant white matter changes are evident within the brain stem. Flow is present in the major intracranial arteries.  The globes orbits are intact.  The paranasal sinuses and mastoid air cells are clear.  The postcontrast images demonstrate no focal areas of pathologic enhancement.  IMPRESSION: 1.  Focal prominence  of the sulci may be related to remote ischemia in the cerebellum bilaterally, right greater than left.  This accounts for the CT finding. 2.  No acute intracranial abnormality. 3.  Age advanced periventricular white matter changes bilaterally. The finding is nonspecific but can be seen in the setting of chronic microvascular ischemia, a demyelinating process such as multiple sclerosis, vasculitis, complicated migraine headaches, or as the sequelae of a prior infectious or inflammatory process.  MRA HEAD  Findings: The internal carotid arteries are within normal limits from high cervical segments through the ICA termini bilaterally. The A1 and M1 segments are normal.  The anterior communicating artery is patent.  The MCA bifurcations are within normal limits. There is mild irregularity of distal small vessels bilaterally.  No significant proximal stenosis or occlusion is evident. An infundibulum is evident at the left posterior communicating artery.  The left vertebral artery is the dominant vessel.  The right vertebral artery essentially terminates at the PICA.  The basilar artery is within normal limits.  The right posterior cerebral artery is of fetal type.  A smaller left posterior communicating artery is present.  The PCA branch vessels are within normal limits bilaterally.  IMPRESSION:  1.  Mild distal small vessel disease within the anterior circulation.  This corresponds to the age advanced white matter disease. 2.  Normal variant circulation without other significant proximal stenosis, aneurysm, or branch vessel occlusion.  MRA NECK  Findings: Time-of-flight images demonstrate no significant flow disturbance at either carotid bifurcation.  The postcontrast images demonstrate a standard three-vessel arch configuration.  Both vertebral arteries originate from the subclavian arteries.  There is a moderate stenosis in the proximal left vertebral artery, the dominant vessel.  No significant stenoses are present  in the remainder of the vertebral arteries. There is a mild to moderate stenosis of the left subclavian artery beyond the vertebral artery.  The innominate artery and right common carotid artery are within normal limits.  The bifurcation is unremarkable.  The right internal carotid artery is normal.  There is slight signal loss in the proximal left vertebral common carotid artery which is likely artifactual.  The bifurcation is within normal limits.  The left internal carotid artery is normal.  IMPRESSION:  1.  Moderate proximal left vertebral artery stenosis, the dominant vessel, without additional stenoses. 2.  Mild to moderate left subclavian  artery stenosis just beyond the vertebral artery. 3.  Normal appearance of the anterior circulation appear   Original Report Authenticated By: Marin Roberts, M.D.   Mr Angiogram Neck W Wo Contrast  10/16/2012   *RADIOLOGY REPORT*  Clinical Data:  Left-sided weakness and slurred speech.  Stroke. Abnormal CT of the head.  MRI HEAD WITHOUT AND WITH CONTRAST MRA HEAD WITHOUT CONTRAST MRA NECK WITHOUT AND WITH CONTRAST  Technique:  Multiplanar, multiecho pulse sequences of the brain and surrounding structures were obtained without and with intravenous contrast.  Angiographic images of the Circle of Willis were obtained using MRA technique without intravenous contrast. Angiographic images of the neck were obtained using MRA technique without and with intravenous contrast.  Carotid stenosis measurements (when applicable) are obtained utilizing NASCET criteria, using the distal internal carotid diameter as the denominator.  Contrast: 15mL MULTIHANCE GADOBENATE DIMEGLUMINE 529 MG/ML IV SOLN  Comparison:  CT head without contrast 10/16/2012.  MRI HEAD  Findings:   The diffusion weighted images demonstrate no evidence for acute or subacute infarction.  Focal prominence of the sulci accounts for the abnormal CT finding.  This may be related to a remote infarct.  There is a  similar, slightly less prominent, area on the left.  Periventricular white matter changes are more prominent left than right.  These are advanced for age.  No significant white matter changes are evident within the brain stem. Flow is present in the major intracranial arteries.  The globes orbits are intact.  The paranasal sinuses and mastoid air cells are clear.  The postcontrast images demonstrate no focal areas of pathologic enhancement.  IMPRESSION: 1.  Focal prominence of the sulci may be related to remote ischemia in the cerebellum bilaterally, right greater than left.  This accounts for the CT finding. 2.  No acute intracranial abnormality. 3.  Age advanced periventricular white matter changes bilaterally. The finding is nonspecific but can be seen in the setting of chronic microvascular ischemia, a demyelinating process such as multiple sclerosis, vasculitis, complicated migraine headaches, or as the sequelae of a prior infectious or inflammatory process.  MRA HEAD  Findings: The internal carotid arteries are within normal limits from high cervical segments through the ICA termini bilaterally. The A1 and M1 segments are normal.  The anterior communicating artery is patent.  The MCA bifurcations are within normal limits. There is mild irregularity of distal small vessels bilaterally.  No significant proximal stenosis or occlusion is evident. An infundibulum is evident at the left posterior communicating artery.  The left vertebral artery is the dominant vessel.  The right vertebral artery essentially terminates at the PICA.  The basilar artery is within normal limits.  The right posterior cerebral artery is of fetal type.  A smaller left posterior communicating artery is present.  The PCA branch vessels are within normal limits bilaterally.  IMPRESSION:  1.  Mild distal small vessel disease within the anterior circulation.  This corresponds to the age advanced white matter disease. 2.  Normal variant  circulation without other significant proximal stenosis, aneurysm, or branch vessel occlusion.  MRA NECK  Findings: Time-of-flight images demonstrate no significant flow disturbance at either carotid bifurcation.  The postcontrast images demonstrate a standard three-vessel arch configuration.  Both vertebral arteries originate from the subclavian arteries.  There is a moderate stenosis in the proximal left vertebral artery, the dominant vessel.  No significant stenoses are present in the remainder of the vertebral arteries. There is a mild to moderate stenosis of the  left subclavian artery beyond the vertebral artery.  The innominate artery and right common carotid artery are within normal limits.  The bifurcation is unremarkable.  The right internal carotid artery is normal.  There is slight signal loss in the proximal left vertebral common carotid artery which is likely artifactual.  The bifurcation is within normal limits.  The left internal carotid artery is normal.  IMPRESSION:  1.  Moderate proximal left vertebral artery stenosis, the dominant vessel, without additional stenoses. 2.  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 3.  Normal appearance of the anterior circulation appear   Original Report Authenticated By: Marin Roberts, M.D.   Mr Laqueta Jean Wo Contrast  10/16/2012   *RADIOLOGY REPORT*  Clinical Data:  Left-sided weakness and slurred speech.  Stroke. Abnormal CT of the head.  MRI HEAD WITHOUT AND WITH CONTRAST MRA HEAD WITHOUT CONTRAST MRA NECK WITHOUT AND WITH CONTRAST  Technique:  Multiplanar, multiecho pulse sequences of the brain and surrounding structures were obtained without and with intravenous contrast.  Angiographic images of the Circle of Willis were obtained using MRA technique without intravenous contrast. Angiographic images of the neck were obtained using MRA technique without and with intravenous contrast.  Carotid stenosis measurements  (when applicable) are obtained utilizing NASCET criteria, using the distal internal carotid diameter as the denominator.  Contrast: 15mL MULTIHANCE GADOBENATE DIMEGLUMINE 529 MG/ML IV SOLN  Comparison:  CT head without contrast 10/16/2012.  MRI HEAD  Findings:   The diffusion weighted images demonstrate no evidence for acute or subacute infarction.  Focal prominence of the sulci accounts for the abnormal CT finding.  This may be related to a remote infarct.  There is a similar, slightly less prominent, area on the left.  Periventricular white matter changes are more prominent left than right.  These are advanced for age.  No significant white matter changes are evident within the brain stem. Flow is present in the major intracranial arteries.  The globes orbits are intact.  The paranasal sinuses and mastoid air cells are clear.  The postcontrast images demonstrate no focal areas of pathologic enhancement.  IMPRESSION: 1.  Focal prominence of the sulci may be related to remote ischemia in the cerebellum bilaterally, right greater than left.  This accounts for the CT finding. 2.  No acute intracranial abnormality. 3.  Age advanced periventricular white matter changes bilaterally. The finding is nonspecific but can be seen in the setting of chronic microvascular ischemia, a demyelinating process such as multiple sclerosis, vasculitis, complicated migraine headaches, or as the sequelae of a prior infectious or inflammatory process.  MRA HEAD  Findings: The internal carotid arteries are within normal limits from high cervical segments through the ICA termini bilaterally. The A1 and M1 segments are normal.  The anterior communicating artery is patent.  The MCA bifurcations are within normal limits. There is mild irregularity of distal small vessels bilaterally.  No significant proximal stenosis or occlusion is evident. An infundibulum is evident at the left posterior communicating artery.  The left vertebral artery is the  dominant vessel.  The right vertebral artery essentially terminates at the PICA.  The basilar artery is within normal limits.  The right posterior cerebral artery is of fetal type.  A smaller left posterior communicating artery is present.  The PCA branch vessels are within normal limits bilaterally.  IMPRESSION:  1.  Mild distal small vessel disease within the anterior circulation.  This corresponds to the age advanced white  matter disease. 2.  Normal variant circulation without other significant proximal stenosis, aneurysm, or branch vessel occlusion.  MRA NECK  Findings: Time-of-flight images demonstrate no significant flow disturbance at either carotid bifurcation.  The postcontrast images demonstrate a standard three-vessel arch configuration.  Both vertebral arteries originate from the subclavian arteries.  There is a moderate stenosis in the proximal left vertebral artery, the dominant vessel.  No significant stenoses are present in the remainder of the vertebral arteries. There is a mild to moderate stenosis of the left subclavian artery beyond the vertebral artery.  The innominate artery and right common carotid artery are within normal limits.  The bifurcation is unremarkable.  The right internal carotid artery is normal.  There is slight signal loss in the proximal left vertebral common carotid artery which is likely artifactual.  The bifurcation is within normal limits.  The left internal carotid artery is normal.  IMPRESSION:  1.  Moderate proximal left vertebral artery stenosis, the dominant vessel, without additional stenoses. 2.  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 3.  Normal appearance of the anterior circulation appear   Original Report Authenticated By: Marin Roberts, M.D.    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  She was reportedly normal this morning at 8a, making coffee, when she fell into a dresser.  Her husband caught her and lowered her to the ground.  No head trauma.  Fire reported left sided weakness, disorientation and dysarthria.  EMS reported normal grip strength, but persistent disorientation and dysarthria, although improving.  She also has retrograde amnesia for events that occurred yesterday.  She was able to tell me her name on arrival.  HR with EMS 88, FSBS 123, BP 114/57.  Pt taken to the CT scanner.  Neurology at bedside.  10:22 AM CT with likely R cerebellar stroke.  MR studies ordered to investigate further.  Medicine consulted for admission.  Mental status, exam improving.  Clinical Impression: 1. Altered mental status.  Disposition: Admit  Condition: Fair   I have discussed the results, Dx and Tx plan. They understand and agree with plan for admission.  Exam unchanged at admission.   Pt seen in conjunction with Dr. Manus Gunning.  Reine Just. Beverely Pace, MD Emergency Medicine PGY-III [phone]   Oleh Genin, MD 10/16/12 [phone]

## 2012-10-16 NOTE — Code Documentation (Signed)
57 year old female presents to [facility] as Code stroke.  Code was called at 0847 with ETA of 15 mins.  Stroke team arrived at 67.  Patient arrived to ED at 0857.  Neurologist present at 0857.  EMS reports that patients husband stated she was fine at 0800.  She made coffee and came back to bedroom where she had near syncopal episode - he caught her and eased her to the floor.  She hit the dresser with her arm hard enough to break a piece of it off. EMS reports incontinence and she smells of urine.   On arrival she is alert but tends to loose focus quickly  She was cleared and taken to CT scan.  NIHSS was 1 for drowsy otherwise nonfocal.  Speech was slurred but improved and patient states baseline without her dentures. Patient c/o only of H/A - states she has had migraines in past but this is different than she remembers. Patient has no recollection of syncope event this am.    O2 sats 89% on RA - placed on 2 liter nasal cannula - 95% - patient denies SOB or chest pain - states she does smoke.   No family available as of yet - multiple attempts by neurologist to contact by phone.   NS bolus given.  SR.  CBG WNL.  Sx mild and improving - handoff to Gaffer.

## 2012-10-16 NOTE — ED Notes (Signed)
Pt in MRI, which is why VS and Neuro checks are not being done.

## 2012-10-16 NOTE — ED Notes (Signed)
Pt leaving for MRI.  

## 2012-10-16 NOTE — ED Notes (Signed)
Per EMS- Pt comes from home collapsed this morning after making coffee, husband was able to catch her but she broke the wooden dresser she fell into. Initially she had left sided weakness and speech was garbled and slurred. When EMS arrived she had equal grip strengths but the slurred speech remained. Pt is from [place] does not have PCP here, has been off blood thinners since last year. BP 114/57, HR 87, CBG 123. Speech remains slurred, LSN is unknown because she does not remember what she did yesterday after lunch.

## 2012-10-17 ENCOUNTER — Inpatient Hospital Stay (HOSPITAL_COMMUNITY): Payer: Medicare HMO

## 2012-10-17 DIAGNOSIS — I059 Rheumatic mitral valve disease, unspecified: Secondary | ICD-10-CM

## 2012-10-17 LAB — LIPID PANEL
Cholesterol: 303 mg/dL — ABNORMAL HIGH (ref 0–200)
LDL Cholesterol: 189 mg/dL — ABNORMAL HIGH (ref 0–99)
Total CHOL/HDL Ratio: 3.4 RATIO
Triglycerides: 128 mg/dL (ref <150)
VLDL: 26 mg/dL (ref 0–40)

## 2012-10-17 MED ORDER — FOLIC ACID 1 MG PO TABS
1.0000 mg | ORAL_TABLET | Freq: Every day | ORAL | Status: DC
  Administered 2012-10-18: 1 mg via ORAL
  Filled 2012-10-17: qty 1

## 2012-10-17 NOTE — Progress Notes (Signed)
Subjective: Ann [name] is a 58 y.o. woman PMH COPD, benign cardiac tumor, anxiety disorder who presents with history of a fall and slurred speech.  Patient seen at bedside this morning. She feels well. No numbness, weakness, syncope noted overnight. We discussed her medication use. Since she travels a lot she often shuffles pills between old bottles and bags. She does not suspect a family member is taking her medicines. She says her husband only uses her inhalers. She is not sure the last time she took her Xanax, or how much. She is not sure the last time she took her amitriptyline, or how much.  Objective: Vital signs in last 24 hours: Filed Vitals:   10/16/12 1914 10/16/12 2200 10/17/12 0254 10/17/12 0944  BP: 105/51 122/58 106/54 113/66  Pulse: 84 85 90 84  Temp: 97.4 F (36.3 C) 97.8 F (36.6 C) 97.9 F (36.6 C) 98 F (36.7 C)  TempSrc: Oral Oral Oral Oral  Resp: 20 20 19 20   Height:      Weight:      SpO2: 94% 94% 96% 97%   Weight change:  No intake or output data in the 24 hours ending 10/17/12 1713  Physical Exam  Constitutional: She is oriented to person, place, and time and well-developed, well-nourished, and in no distress.  HENT:  Head: Normocephalic and atraumatic.  Cardiovascular: Normal rate, regular rhythm, normal heart sounds and intact distal pulses.  Exam reveals no gallop and no friction rub.   No murmur heard. Pulmonary/Chest: Effort normal and breath sounds normal. No respiratory distress. She has no wheezes. She has no rales. She exhibits no tenderness.  Abdominal: Soft. There is no tenderness.  Musculoskeletal: Normal range of motion. She exhibits no edema and no tenderness.  Neurological: She is alert and oriented to person, place, and time. She has normal sensation and normal strength. GCS score is 15.  Psychiatric: She is agitated (Scratching head often, appears a little bit antsy).     Lab Results: Basic Metabolic Panel:  Recent Labs Lab  10/16/12 0900 10/16/12 0917  NA 133* 137  K 3.9 3.9  CL 98 104  CO2 20  --   GLUCOSE 122* 120*  BUN 15 14  CREATININE 0.80 0.80  CALCIUM 9.4  --    Liver Function Tests:  Recent Labs Lab 10/16/12 0900  AST 23  ALT 30  ALKPHOS 150*  BILITOT 0.3  PROT 7.4  ALBUMIN 3.8   CBC:  Recent Labs Lab 10/16/12 0900 10/16/12 0917  WBC 7.5  --   NEUTROABS 5.3  --   HGB 12.9 13.6  HCT 38.4 40.0  MCV 90.4  --   PLT 210  --    Cardiac Enzymes:  Recent Labs Lab 10/16/12 0908  TROPONINI <0.30   CBG:  Recent Labs Lab 10/16/12 0916  GLUCAP 125*   Hemoglobin A1C:  Recent Labs Lab 10/16/12 2127  HGBA1C 5.6   Fasting Lipid Panel:  Recent Labs Lab 10/17/12 0600  CHOL 303*  HDL 88  LDLCALC 189*  TRIG 128  CHOLHDL 3.4   Coagulation:  Recent Labs Lab 10/16/12 0900  LABPROT 13.0  INR 1.00   Urine Drug Screen: Drugs of Abuse     Component Value Date/Time   LABOPIA NONE DETECTED 10/16/2012 1404   COCAINSCRNUR NONE DETECTED 10/16/2012 1404   LABBENZ NONE DETECTED 10/16/2012 1404   AMPHETMU NONE DETECTED 10/16/2012 1404   THCU NONE DETECTED 10/16/2012 1404   LABBARB NONE DETECTED 10/16/2012 1404  Alcohol Level:  Recent Labs Lab 10/16/12 0900  ETH <11   Urinalysis:  Recent Labs Lab 10/16/12 1404  COLORURINE YELLOW  LABSPEC 1.016  PHURINE 7.5  GLUCOSEU NEGATIVE  HGBUR NEGATIVE  BILIRUBINUR NEGATIVE  KETONESUR NEGATIVE  PROTEINUR NEGATIVE  UROBILINOGEN 0.2  NITRITE NEGATIVE  LEUKOCYTESUR NEGATIVE    Micro Results: No results found for this or any previous visit (from the past 240 hour(s)). Studies/Results: Dg Chest 2 View  10/16/2012   *RADIOLOGY REPORT*  Clinical Data: , stroke  CHEST - 2 VIEW  Comparison: None.  Findings: The heart and pulmonary vascularity are within normal limits.  Mild interstitial changes are noted bilaterally without focal infiltrate.  Radiopaque leads are noted over the chest.  IMPRESSION: No acute abnormality is noted.    Original Report Authenticated By: Alcide Clever, M.D.   Ct Head Wo Contrast  10/16/2012   **ADDENDUM** CREATED: 10/16/2012 09:28:58  These results were called by telephone on October 16, 2012 at 09:24 a.m. to Dr. Petra Kuba, who verbally acknowledged these results.  **END ADDENDUM** SIGNED BY: Rutherford Guys. Margarita Grizzle, M.D.  10/16/2012   *RADIOLOGY REPORT*  Clinical Data: Left-sided weakness; slurring of speech  CT HEAD WITHOUT CONTRAST  Technique:  Contiguous axial images were obtained from the base of the skull through the vertex without contrast. Study was obtained within 24 hours of patient arrival at the emergency department  Comparison: None.  Findings:  There is age-related volume loss.  There is an area of decreased attenuation in the posterior mid right cerebellum measuring 1.7 x 1.4 cm, suspicious for recent infarct.  There is no other evidence suggesting acute infarct.  There is patchy small vessel disease in the centra semiovale bilaterally, more on the left than on the right.  There is no appreciable mass effect, hemorrhage, extra-axial fluid collection, midline shift.  Bony calvarium appears intact.  The mastoid air cells are clear.  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  There is age-related volume loss with patchy supratentorial small vessel disease.  No hemorrhage or appreciable mass effect.  Original Report Authenticated By: Bretta Bang, M.D.   Mr [facility] Wo Contrast  10/16/2012   *RADIOLOGY REPORT*  Clinical Data:  Left-sided weakness and slurred speech.  Stroke. Abnormal CT of the head.  MRI HEAD WITHOUT AND WITH CONTRAST MRA HEAD WITHOUT CONTRAST MRA NECK WITHOUT AND WITH CONTRAST  Technique:  Multiplanar, multiecho pulse sequences of the brain and surrounding structures were obtained without and with intravenous contrast.  Angiographic images of the Circle of Willis were obtained using MRA technique without intravenous contrast.  Angiographic images of the neck were obtained using MRA technique without and with intravenous contrast.  Carotid stenosis measurements (when applicable) are obtained utilizing NASCET criteria, using the distal internal carotid diameter as the denominator.  Contrast: 15mL MULTIHANCE GADOBENATE DIMEGLUMINE 529 MG/ML IV SOLN  Comparison:  CT head without contrast 10/16/2012.  MRI HEAD  Findings:   The diffusion weighted images demonstrate no evidence for acute or subacute infarction.  Focal prominence of the sulci accounts for the abnormal CT finding.  This may be related to a remote infarct.  There is a similar, slightly less prominent, area on the left.  Periventricular white matter changes are more prominent left than right.  These are advanced for age.  No significant white matter changes are evident within the brain stem. Flow is present in the major intracranial arteries.  The globes orbits are intact.  The  paranasal sinuses and mastoid air cells are clear.  The postcontrast images demonstrate no focal areas of pathologic enhancement.  IMPRESSION: 1.  Focal prominence of the sulci may be related to remote ischemia in the cerebellum bilaterally, right greater than left.  This accounts for the CT finding. 2.  No acute intracranial abnormality. 3.  Age advanced periventricular white matter changes bilaterally. The finding is nonspecific but can be seen in the setting of chronic microvascular ischemia, a demyelinating process such as multiple sclerosis, vasculitis, complicated migraine headaches, or as the sequelae of a prior infectious or inflammatory process.  MRA HEAD  Findings: The internal carotid arteries are within normal limits from high cervical segments through the ICA termini bilaterally. The A1 and M1 segments are normal.  The anterior communicating artery is patent.  The MCA bifurcations are within normal limits. There is mild irregularity of distal small vessels bilaterally.  No significant proximal  stenosis or occlusion is evident. An infundibulum is evident at the left posterior communicating artery.  The left vertebral artery is the dominant vessel.  The right vertebral artery essentially terminates at the PICA.  The basilar artery is within normal limits.  The right posterior cerebral artery is of fetal type.  A smaller left posterior communicating artery is present.  The PCA branch vessels are within normal limits bilaterally.  IMPRESSION:  1.  Mild distal small vessel disease within the anterior circulation.  This corresponds to the age advanced white matter disease. 2.  Normal variant circulation without other significant proximal stenosis, aneurysm, or branch vessel occlusion.  MRA NECK  Findings: Time-of-flight images demonstrate no significant flow disturbance at either carotid bifurcation.  The postcontrast images demonstrate a standard three-vessel arch configuration.  Both vertebral arteries originate from the subclavian arteries.  There is a moderate stenosis in the proximal left vertebral artery, the dominant vessel.  No significant stenoses are present in the remainder of the vertebral arteries. There is a mild to moderate stenosis of the left subclavian artery beyond the vertebral artery.  The innominate artery and right common carotid artery are within normal limits.  The bifurcation is unremarkable.  The right internal carotid artery is normal.  There is slight signal loss in the proximal left vertebral common carotid artery which is likely artifactual.  The bifurcation is within normal limits.  The left internal carotid artery is normal.  IMPRESSION:  1.  Moderate proximal left vertebral artery stenosis, the dominant vessel, without additional stenoses. 2.  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 3.  Normal appearance of the anterior circulation appear   Original Report Authenticated By: Marin Roberts, M.D.   Mr Angiogram Neck W Wo  Contrast  10/16/2012   *RADIOLOGY REPORT*  Clinical Data:  Left-sided weakness and slurred speech.  Stroke. Abnormal CT of the head.  MRI HEAD WITHOUT AND WITH CONTRAST MRA HEAD WITHOUT CONTRAST MRA NECK WITHOUT AND WITH CONTRAST  Technique:  Multiplanar, multiecho pulse sequences of the brain and surrounding structures were obtained without and with intravenous contrast.  Angiographic images of the Circle of Willis were obtained using MRA technique without intravenous contrast. Angiographic images of the neck were obtained using MRA technique without and with intravenous contrast.  Carotid stenosis measurements (when applicable) are obtained utilizing NASCET criteria, using the distal internal carotid diameter as the denominator.  Contrast: 15mL MULTIHANCE GADOBENATE DIMEGLUMINE 529 MG/ML IV SOLN  Comparison:  CT head without contrast 10/16/2012.  MRI HEAD  Findings:  The diffusion weighted images demonstrate no evidence for acute or subacute infarction.  Focal prominence of the sulci accounts for the abnormal CT finding.  This may be related to a remote infarct.  There is a similar, slightly less prominent, area on the left.  Periventricular white matter changes are more prominent left than right.  These are advanced for age.  No significant white matter changes are evident within the brain stem. Flow is present in the major intracranial arteries.  The globes orbits are intact.  The paranasal sinuses and mastoid air cells are clear.  The postcontrast images demonstrate no focal areas of pathologic enhancement.  IMPRESSION: 1.  Focal prominence of the sulci may be related to remote ischemia in the cerebellum bilaterally, right greater than left.  This accounts for the CT finding. 2.  No acute intracranial abnormality. 3.  Age advanced periventricular white matter changes bilaterally. The finding is nonspecific but can be seen in the setting of chronic microvascular ischemia, a demyelinating process such as  multiple sclerosis, vasculitis, complicated migraine headaches, or as the sequelae of a prior infectious or inflammatory process.  MRA HEAD  Findings: The internal carotid arteries are within normal limits from high cervical segments through the ICA termini bilaterally. The A1 and M1 segments are normal.  The anterior communicating artery is patent.  The MCA bifurcations are within normal limits. There is mild irregularity of distal small vessels bilaterally.  No significant proximal stenosis or occlusion is evident. An infundibulum is evident at the left posterior communicating artery.  The left vertebral artery is the dominant vessel.  The right vertebral artery essentially terminates at the PICA.  The basilar artery is within normal limits.  The right posterior cerebral artery is of fetal type.  A smaller left posterior communicating artery is present.  The PCA branch vessels are within normal limits bilaterally.  IMPRESSION:  1.  Mild distal small vessel disease within the anterior circulation.  This corresponds to the age advanced white matter disease. 2.  Normal variant circulation without other significant proximal stenosis, aneurysm, or branch vessel occlusion.  MRA NECK  Findings: Time-of-flight images demonstrate no significant flow disturbance at either carotid bifurcation.  The postcontrast images demonstrate a standard three-vessel arch configuration.  Both vertebral arteries originate from the subclavian arteries.  There is a moderate stenosis in the proximal left vertebral artery, the dominant vessel.  No significant stenoses are present in the remainder of the vertebral arteries. There is a mild to moderate stenosis of the left subclavian artery beyond the vertebral artery.  The innominate artery and right common carotid artery are within normal limits.  The bifurcation is unremarkable.  The right internal carotid artery is normal.  There is slight signal loss in the proximal left vertebral common  carotid artery which is likely artifactual.  The bifurcation is within normal limits.  The left internal carotid artery is normal.  IMPRESSION:  1.  Moderate proximal left vertebral artery stenosis, the dominant vessel, without additional stenoses. 2.  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 3.  Normal appearance of the anterior circulation appear   Original Report Authenticated By: Marin Roberts, M.D.   Mr [facility] Wo Contrast  10/16/2012   *RADIOLOGY REPORT*  Clinical Data:  Left-sided weakness and slurred speech.  Stroke. Abnormal CT of the head.  MRI HEAD WITHOUT AND WITH CONTRAST MRA HEAD WITHOUT CONTRAST MRA NECK WITHOUT AND WITH CONTRAST  Technique:  Multiplanar, multiecho pulse sequences  of the brain and surrounding structures were obtained without and with intravenous contrast.  Angiographic images of the Circle of Willis were obtained using MRA technique without intravenous contrast. Angiographic images of the neck were obtained using MRA technique without and with intravenous contrast.  Carotid stenosis measurements (when applicable) are obtained utilizing NASCET criteria, using the distal internal carotid diameter as the denominator.  Contrast: 15mL MULTIHANCE GADOBENATE DIMEGLUMINE 529 MG/ML IV SOLN  Comparison:  CT head without contrast 10/16/2012.  MRI HEAD  Findings:   The diffusion weighted images demonstrate no evidence for acute or subacute infarction.  Focal prominence of the sulci accounts for the abnormal CT finding.  This may be related to a remote infarct.  There is a similar, slightly less prominent, area on the left.  Periventricular white matter changes are more prominent left than right.  These are advanced for age.  No significant white matter changes are evident within the brain stem. Flow is present in the major intracranial arteries.  The globes orbits are intact.  The paranasal sinuses and mastoid air cells are clear.  The  postcontrast images demonstrate no focal areas of pathologic enhancement.  IMPRESSION: 1.  Focal prominence of the sulci may be related to remote ischemia in the cerebellum bilaterally, right greater than left.  This accounts for the CT finding. 2.  No acute intracranial abnormality. 3.  Age advanced periventricular white matter changes bilaterally. The finding is nonspecific but can be seen in the setting of chronic microvascular ischemia, a demyelinating process such as multiple sclerosis, vasculitis, complicated migraine headaches, or as the sequelae of a prior infectious or inflammatory process.  MRA HEAD  Findings: The internal carotid arteries are within normal limits from high cervical segments through the ICA termini bilaterally. The A1 and M1 segments are normal.  The anterior communicating artery is patent.  The MCA bifurcations are within normal limits. There is mild irregularity of distal small vessels bilaterally.  No significant proximal stenosis or occlusion is evident. An infundibulum is evident at the left posterior communicating artery.  The left vertebral artery is the dominant vessel.  The right vertebral artery essentially terminates at the PICA.  The basilar artery is within normal limits.  The right posterior cerebral artery is of fetal type.  A smaller left posterior communicating artery is present.  The PCA branch vessels are within normal limits bilaterally.  IMPRESSION:  1.  Mild distal small vessel disease within the anterior circulation.  This corresponds to the age advanced white matter disease. 2.  Normal variant circulation without other significant proximal stenosis, aneurysm, or branch vessel occlusion.  MRA NECK  Findings: Time-of-flight images demonstrate no significant flow disturbance at either carotid bifurcation.  The postcontrast images demonstrate a standard three-vessel arch configuration.  Both vertebral arteries originate from the subclavian arteries.  There is a moderate  stenosis in the proximal left vertebral artery, the dominant vessel.  No significant stenoses are present in the remainder of the vertebral arteries. There is a mild to moderate stenosis of the left subclavian artery beyond the vertebral artery.  The innominate artery and right common carotid artery are within normal limits.  The bifurcation is unremarkable.  The right internal carotid artery is normal.  There is slight signal loss in the proximal left vertebral common carotid artery which is likely artifactual.  The bifurcation is within normal limits.  The left internal carotid artery is normal.  IMPRESSION:  1.  Moderate proximal left vertebral artery stenosis, the dominant  vessel, without additional stenoses. 2.  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 3.  Normal appearance of the anterior circulation appear   Original Report Authenticated By: Marin Roberts, M.D.   Medications: I have reviewed the patient's current medications. Scheduled Meds: . aspirin EC  81 mg Oral Daily  . [START ON 10/18/2012] folic acid  1 mg Oral Daily  . heparin  5,000 Units Subcutaneous Q8H  . multivitamin with minerals  1 tablet Oral Daily  . nicotine  14 mg Transdermal Q24H  . sodium chloride  3 mL Intravenous Q12H  . thiamine  100 mg Oral Daily   Continuous Infusions:  PRN Meds:.acetaminophen, ALPRAZolam Assessment/Plan: Ann [name] is a 57 y.o. woman PMH COPD, benign cardiac tumor, anxiety disorder who presents with history of a fall and slurred speech.  #Syncope vs. presyncope - We have ruled out CVA. Neurology was consulted and would like an EEG to rule out seizure, but they agree this is a less likely diagnosis. History is suggestive of pre-syncopal event secondary to over-medication, most likely with amitriptyline since her UDS is negative for benzos or opiates. Concern for medication diversion though she denies this. Echo was performed and WNL. Carotid  dopplers WNL. She has had no arrhythmias on telemetry. Her PCP in [place] was called. He is also concerned about her medication use and possible diversion, and agreed with discontinuing amitriptyline due to safety risk. We will forward our discharge summary and [place] Controlled Substance database inquiry to him on discharge. A1C wnl. - If EEG and telemetry are unremarkable, medically stable for discharge to home - Will discontinue amitriptyline on discharge as she is not taking it properly - Will start pravastatin 40 on discharge given LDL of 189 - Will start daily ASA on discharge - PT recommended outpatient PT and supervision for mobility/OOB. She reported at least 6 falls in the past 6 months to them.  #Fall against object - Causing left sided soreness, reproducable on palpation. Troponin negative. Husband describes fall into furniture in that exact area.  - Tylenol prn pain  #COPD - Has not used inhalers in months. Not on home O2. Currently everyday smoker.  - Continue to monitor, will prescribe duonebs if needed   #DVT PPX - subq heparin  Dispo: Disposition is deferred at this time, awaiting improvement of current medical problems.  Anticipated discharge in approximately 1-3 day(s).   The patient does have a current PCP (Pcp Not In System) and does need an [facility] hospital follow-up appointment after discharge.  The patient does not have transportation limitations that hinder transportation to clinic appointments.  .Services Needed at time of discharge: Y = Yes, Blank = No PT:   OT:   RN:   Equipment:   Other:     LOS: 1 day   Vivi Barrack, MD 10/17/2012, 5:13 PM

## 2012-10-17 NOTE — Progress Notes (Signed)
  Echocardiogram 2D Echocardiogram has been performed.  Ann [name] 10/17/2012, 9:27 AM

## 2012-10-17 NOTE — Evaluation (Signed)
Physical Therapy Evaluation Patient Details Name: Ayahna Solazzo MRN: [id number] DOB: 1955-08-04 Today's Date: 10/17/2012 Time: 7829-5621 PT Time Calculation (min): 20 min  PT Assessment / Plan / Recommendation History of Present Illness  Mrs. Nathaniel Yaden is a 57 y.o. woman PMH COPD, benign cardiac tumor, anxiety disorder who presents with history of a fall and slurred speech.  Clinical Impression  Patient reports she has returned to her baseline however reports to me that she has had at least 6 falls in the past 6 months. During our evaluation today she demonstrated increased risk for recurrent falls with DGI score 15/24. She also reports a h/o vertigo and neuropathy that may be contributing to her instability. Will benefit physical therapy in the acute setting to maximize safety with gait and mobility prior to d/c home. Patient and husband have been educated on her need for further therapy to try to decrease her risk for falls. She is agreeable.     PT Assessment  Patient needs continued PT services    Follow Up Recommendations  Outpatient PT;Supervision for mobility/OOB    Does the patient have the potential to tolerate intense rehabilitation      Barriers to Discharge        Equipment Recommendations  None recommended by PT    Recommendations for Other Services     Frequency Min 3X/week    Precautions / Restrictions Precautions Precautions: Fall Precaution Comments: has a history of falls reporting at least 6 in the past 6 months, jumpy impulsive personality Restrictions Weight Bearing Restrictions: No   Pertinent Vitals/Pain Denies pain     Mobility  Bed Mobility Bed Mobility: Supine to Sit Supine to Sit: 7: Independent Transfers Transfers: Sit to Stand;Stand to Sit Sit to Stand: 6: Modified independent (Device/Increase time);With upper extremity assist Stand to Sit: 6: Modified independent (Device/Increase time);With upper extremity  assist Ambulation/Gait Ambulation/Gait Assistance: 5: Supervision Ambulation Distance (Feet): 800 Feet Assistive device: None Ambulation/Gait Assistance Details: gaurding for stability especially with more dynamic challenges as pt unsteady with increased lateral sway and slower response time with righting and stepping reactions Gait Pattern: Step-through pattern;Decreased stride length;Antalgic Stairs: Yes Stairs Assistance: 6: Modified independent (Device/Increase time) Stair Management Technique: Two rails Number of Stairs: 3 Modified Rankin (Stroke Patients Only) Pre-Morbid Rankin Score: No symptoms Modified Rankin: No symptoms    Exercises     PT Diagnosis: Abnormality of gait  PT Problem List: Decreased balance PT Treatment Interventions: DME instruction;Gait training;Therapeutic exercise;Therapeutic activities;Balance training;Neuromuscular re-education;Patient/family education     PT Goals(Current goals can be found in the care plan section) Acute Rehab PT Goals Patient Stated Goal: home PT Goal Formulation: With patient Time For Goal Achievement: 10/24/12 Potential to Achieve Goals: Good  Visit Information  Last PT Received On: 10/17/12 Assistance Needed: +1 History of Present Illness: Mrs. Jaloni Sorber is a 57 y.o. woman PMH COPD, benign cardiac tumor, anxiety disorder who presents with history of a fall and slurred speech.       Prior Functioning  Home Living Family/patient expects to be discharged to:: Private residence Living Arrangements: Spouse/significant other;Children (children and husband in and out) Available Help at Discharge: Family Type of Home: House Home Access: Stairs to enter Secretary/administrator of Steps: 1 Entrance Stairs-Rails: Right;Left;Can reach both Home Layout: One level Home Equipment: None Prior Function Level of Independence: Independent Communication Communication: No difficulties    Cognition  Cognition Arousal/Alertness:  Awake/alert Behavior During Therapy: Anxious;Impulsive Overall Cognitive Status: Within Functional Limits for tasks assessed Area  of Impairment: Safety/judgement Safety/Judgement: Decreased awareness of safety;Decreased awareness of deficits General Comments: doesn't have a great understanding of how her impulsivity in addition to her vertigo and sensation changes may all affect her balance, has had many falls in the past 6 months but has not saught medical attention to figue out why    Extremity/Trunk Assessment Upper Extremity Assessment Upper Extremity Assessment: Defer to OT evaluation Lower Extremity Assessment Lower Extremity Assessment: Overall WFL for tasks assessed   Balance Standardized Balance Assessment Standardized Balance Assessment: Dynamic Gait Index Dynamic Gait Index Level Surface: Mild Impairment Change in Gait Speed: Mild Impairment Gait with Horizontal Head Turns: Mild Impairment Gait with Vertical Head Turns: Mild Impairment Gait and Pivot Turn: Moderate Impairment Step Over Obstacle: Mild Impairment Step Around Obstacles: Mild Impairment Steps: Mild Impairment Total Score: 15 High Level Balance High Level Balance Activites: Backward walking;Direction changes;Sudden stops;Head turns High Level Balance Comments: mingaurdA for all above activities  End of Session PT - End of Session Equipment Utilized During Treatment: Gait belt Activity Tolerance: Patient tolerated treatment well Patient left: in bed;with call bell/phone within reach;with family/visitor present  GP Functional Limitation: Mobility: Walking and moving around Mobility: Walking and Moving Around Current Status ([id number]): At least 1 percent but less than 20 percent impaired, limited or restricted Mobility: Walking and Moving Around Goal Status [phone]): 0 percent impaired, limited or restricted   Nilesh Stegall H 10/17/2012, 4:50 PM

## 2012-10-17 NOTE — ED Provider Notes (Signed)
I saw and evaluated the patient, reviewed the resident's note and I agree with the findings and plan. If applicable, I agree with the resident's interpretation of the EKG.  If applicable, I was present for critical portions of any procedures performed.  Fall this morning with L sided weakness, disorientation and slurred speech.  Strength now improved.  Code stroke on arrival. Some dysarthria without apparent aphasia or motor weakness  Glynn Octave, MD 10/17/12 1441

## 2012-10-17 NOTE — Progress Notes (Signed)
*  PRELIMINARY RESULTS* Vascular Ultrasound Carotid Duplex (Doppler) has been completed.  Preliminary findings: Bilateral:  1-39% ICA stenosis.  Vertebral artery flow is antegrade.      Farrel Demark, RDMS, RVT  10/17/2012, 9:34 AM

## 2012-10-17 NOTE — Procedures (Signed)
History: 57 year old female with syncope of unclear etiology  Sedation: None  Background: There is a well defined posterior dominant rhythm of 9 Hz that attenuates with eye opening. The background consists of intermixed alpha and beta activities. There is normal sleep structures observed.  Photic stimulation: Physiologic driving is not performed  EEG Abnormalities: None  Clinical Interpretation: This normal EEG is recorded in the waking and sleep state. There was no seizure or seizure predisposition recorded on this study.   Ritta Slot, MD [facility] [phone]  If 7pm- 7am, please page neurology on call at [phone].

## 2012-10-17 NOTE — Progress Notes (Signed)
OT Cancellation Note  Patient Details Name: Ann [name] MRN: [id number] DOB: [birth year]   Cancelled Treatment:    Reason Eval/Treat Not Completed: Patient at procedure or test/ unavailable (EEG)  Harolyn Rutherford Pager: [phone]  10/17/2012, 3:19 PM

## 2012-10-17 NOTE — Progress Notes (Signed)
Subjective: Reports that she is back to baseline, her headache is much improved.  Exam: Filed Vitals:   10/17/12 0944  BP: 113/66  Pulse: 84  Temp: 98 F (36.7 C)  Resp: 20   Gen: In bed, NAD MS: Awake, alert, no apparent distress, oriented and appropriate CN: Extra ocular movements intact, visual fields full, face symmetric Motor: 5/5 throughout upper tremor these to confrontation, no drift. She does have mild left leg weakness which she reports is chronic. Sensory: Intact to light touch  Impression: 57 year old female who presents with syncope of unknown etiology. The confusion afterwards could be concerning for seizure, however this is not clear to me at this time. Also, there are no signs of benzodiazepine withdrawal to suggest this is an etiology. She does have a heart history, and cardiac syncope could be possibility. If her EEG is normal then I would not recommend starting antiepileptic set this time.  Recommendations: 1) EEG, if normal neurology will sign off. 2) could consider echo/telemetry as workup for cardiac syncope.  Ritta Slot, MD [facility] [phone]  If 7pm- 7am, please page neurology on call at [phone].

## 2012-10-17 NOTE — H&P (Signed)
I saw and evaluated the patient. I personally confirmed the key portions of Dr. Doristine Locks history and exam and reviewed pertinent patient test results. The assessment, diagnosis, and plan were reviewed with the housestaff and I agree with the documentation in the resident's note.  Ann [name] did not have a CVA.  Neurology would like to assess for seizures with an EEG but their pre-test probability is very low.  By history, it sounds more like a pre-syncopal event that may have been secondary to over-medication (amytriptyline) although a cardiac arrythmia can not be completely excluded.  We will obtain the EEG as recommended and assess for any arrhythmias with telemetry.  An Echo was completely normal.  We will convert her to Observational status because if the EEG and telemetry are unremarkable she can be safely discharged home with discontinuance of her amytriptyline as she is unclear how she should be taking it, and therefore may be taking it improperly or dangerously.

## 2012-10-17 NOTE — Progress Notes (Signed)
EEG Completed; Results Pending  

## 2012-10-18 MED ORDER — HYDROCODONE-ACETAMINOPHEN 10-325 MG PO TABS
1.0000 | ORAL_TABLET | Freq: Two times a day (BID) | ORAL | Status: DC | PRN
Start: 2012-10-18 — End: 2012-11-07

## 2012-10-18 MED ORDER — ASPIRIN 81 MG PO TBEC: 81.0000 mg | DELAYED_RELEASE_TABLET | Freq: Every day | ORAL | Status: DC

## 2012-10-18 MED ORDER — ALPRAZOLAM 1 MG PO TABS: 1.0000 mg | ORAL_TABLET | Freq: Every day | ORAL | Status: DC | PRN

## 2012-10-18 NOTE — Progress Notes (Signed)
Patient discharged home. Discharged instructions, home safety precautions and medication information given to patient and family. Patient and spouse stated understanding of instructions given with patient  And spouse to make sure patient follow up with PCP as soon as possible.

## 2012-10-18 NOTE — Progress Notes (Signed)
Subjective: The patient is sitting up in bed this morning and is anxious to leave the hospital. No numbness, weakness, syncope noted overnight. We discussed tapering down on Ann [name] xanax to avoid sedation. She would like to go back to [name] in [place] and we discussed getting a local doctor. She is agreeable to that.   Objective: Vital signs in last 24 hours: Filed Vitals:   10/17/12 2200 10/18/12 0200 10/18/12 0515 10/18/12 0935  BP: 125/63 115/93 108/56 180/56  Pulse: 91 87 87 86  Temp: 98.3 F (36.8 C) 98 F (36.7 C) 97.9 F (36.6 C) 98.1 F (36.7 C)  TempSrc: Oral Oral Oral Oral  Resp: 18 18 20 18   Height:      Weight:      SpO2: 95% 94% 95% 98%   Weight change:   Intake/Output Summary (Last 24 hours) at 10/18/12 1123 Last data filed at 10/18/12 0900  Gross per 24 hour  Intake    240 ml  Output      0 ml  Net    240 ml    Physical Exam  Constitutional: She is oriented to person, place, and time and well-developed, well-nourished, and in no distress.  HENT:  Head: Normocephalic and atraumatic.  Cardiovascular: Regular rhythm.   No murmur heard. Pulmonary/Chest: Effort normal and breath sounds normal. No respiratory distress. She has no wheezes. She exhibits no tenderness.  Abdominal: Soft. Bowel sounds are normal. There is no tenderness.  Neurological: She is alert and oriented to person, place, and time. She has normal sensation and normal strength.    Lab Results: Basic Metabolic Panel:  Recent Labs Lab 10/16/12 0900 10/16/12 0917  NA 133* 137  K 3.9 3.9  CL 98 104  CO2 20  --   GLUCOSE 122* 120*  BUN 15 14  CREATININE 0.80 0.80  CALCIUM 9.4  --    Liver Function Tests:  Recent Labs Lab 10/16/12 0900  AST 23  ALT 30  ALKPHOS 150*  BILITOT 0.3  PROT 7.4  ALBUMIN 3.8   CBC:  Recent Labs Lab 10/16/12 0900 10/16/12 0917  WBC 7.5  --   NEUTROABS 5.3  --   HGB 12.9 13.6  HCT 38.4 40.0  MCV 90.4  --   PLT 210  --    Cardiac  Enzymes:  Recent Labs Lab 10/16/12 0908  TROPONINI <0.30   CBG:  Recent Labs Lab 10/16/12 0916  GLUCAP 125*   Hemoglobin A1C:  Recent Labs Lab 10/16/12 2127  HGBA1C 5.6   Fasting Lipid Panel:  Recent Labs Lab 10/17/12 0600  CHOL 303*  HDL 88  LDLCALC 189*  TRIG 128  CHOLHDL 3.4   Coagulation:  Recent Labs Lab 10/16/12 0900  LABPROT 13.0  INR 1.00   Urine Drug Screen: Drugs of Abuse     Component Value Date/Time   LABOPIA NONE DETECTED 10/16/2012 1404   COCAINSCRNUR NONE DETECTED 10/16/2012 1404   LABBENZ NONE DETECTED 10/16/2012 1404   AMPHETMU NONE DETECTED 10/16/2012 1404   THCU NONE DETECTED 10/16/2012 1404   LABBARB NONE DETECTED 10/16/2012 1404    Alcohol Level:  Recent Labs Lab 10/16/12 0900  ETH <11   Urinalysis:  Recent Labs Lab 10/16/12 1404  COLORURINE YELLOW  LABSPEC 1.016  PHURINE 7.5  GLUCOSEU NEGATIVE  HGBUR NEGATIVE  BILIRUBINUR NEGATIVE  KETONESUR NEGATIVE  PROTEINUR NEGATIVE  UROBILINOGEN 0.2  NITRITE NEGATIVE  LEUKOCYTESUR NEGATIVE   Studies/Results: Mr Maxine Glenn Head/Neck/Brain Wo Contrast  10/16/2012   *  RADIOLOGY REPORT*  Clinical Data:  Left-sided weakness and slurred speech.  Stroke. Abnormal CT of the head.  MRI HEAD WITHOUT AND WITH CONTRAST MRA HEAD WITHOUT CONTRAST MRA NECK WITHOUT AND WITH CONTRAST  Technique:  Multiplanar, multiecho pulse sequences of the brain and surrounding structures were obtained without and with intravenous contrast.  Angiographic images of the Circle of Willis were obtained using MRA technique without intravenous contrast. Angiographic images of the neck were obtained using MRA technique without and with intravenous contrast.  Carotid stenosis measurements (when applicable) are obtained utilizing NASCET criteria, using the distal internal carotid diameter as the denominator.  Contrast: 15mL MULTIHANCE GADOBENATE DIMEGLUMINE 529 MG/ML IV SOLN  Comparison:  CT head without contrast 10/16/2012.  MRI HEAD   Findings:   The diffusion weighted images demonstrate no evidence for acute or subacute infarction.  Focal prominence of the sulci accounts for the abnormal CT finding.  This may be related to a remote infarct.  There is a similar, slightly less prominent, area on the left.  Periventricular white matter changes are more prominent left than right.  These are advanced for age.  No significant white matter changes are evident within the brain stem. Flow is present in the major intracranial arteries.  The globes orbits are intact.  The paranasal sinuses and mastoid air cells are clear.  The postcontrast images demonstrate no focal areas of pathologic enhancement.  IMPRESSION: 1.  Focal prominence of the sulci may be related to remote ischemia in the cerebellum bilaterally, right greater than left.  This accounts for the CT finding. 2.  No acute intracranial abnormality. 3.  Age advanced periventricular white matter changes bilaterally. The finding is nonspecific but can be seen in the setting of chronic microvascular ischemia, a demyelinating process such as multiple sclerosis, vasculitis, complicated migraine headaches, or as the sequelae of a prior infectious or inflammatory process.  MRA HEAD  Findings: The internal carotid arteries are within normal limits from high cervical segments through the ICA termini bilaterally. The A1 and M1 segments are normal.  The anterior communicating artery is patent.  The MCA bifurcations are within normal limits. There is mild irregularity of distal small vessels bilaterally.  No significant proximal stenosis or occlusion is evident. An infundibulum is evident at the left posterior communicating artery.  The left vertebral artery is the dominant vessel.  The right vertebral artery essentially terminates at the PICA.  The basilar artery is within normal limits.  The right posterior cerebral artery is of fetal type.  A smaller left posterior communicating artery is present.  The PCA  branch vessels are within normal limits bilaterally.  IMPRESSION:  1.  Mild distal small vessel disease within the anterior circulation.  This corresponds to the age advanced white matter disease. 2.  Normal variant circulation without other significant proximal stenosis, aneurysm, or branch vessel occlusion.  MRA NECK  Findings: Time-of-flight images demonstrate no significant flow disturbance at either carotid bifurcation.  The postcontrast images demonstrate a standard three-vessel arch configuration.  Both vertebral arteries originate from the subclavian arteries.  There is a moderate stenosis in the proximal left vertebral artery, the dominant vessel.  No significant stenoses are present in the remainder of the vertebral arteries. There is a mild to moderate stenosis of the left subclavian artery beyond the vertebral artery.  The innominate artery and right common carotid artery are within normal limits.  The bifurcation is unremarkable.  The right internal carotid artery is normal.  There is slight  signal loss in the proximal left vertebral common carotid artery which is likely artifactual.  The bifurcation is within normal limits.  The left internal carotid artery is normal.  IMPRESSION:  1.  Moderate proximal left vertebral artery stenosis, the dominant vessel, without additional stenoses. 2.  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 3.  Normal appearance of the anterior circulation appear   Original Report Authenticated By: Marin Roberts, M.D.   Medications: I have reviewed the patient's current medications. Scheduled Meds: . aspirin EC  81 mg Oral Daily  . folic acid  1 mg Oral Daily  . heparin  5,000 Units Subcutaneous Q8H  . multivitamin with minerals  1 tablet Oral Daily  . nicotine  14 mg Transdermal Q24H  . sodium chloride  3 mL Intravenous Q12H  . thiamine  100 mg Oral Daily   Continuous Infusions:  PRN Meds:.acetaminophen,  ALPRAZolam Assessment/Plan: Ann [name] is a 57 y.o. woman PMH COPD, benign cardiac tumor, anxiety disorder who presents with history of a fall and slurred speech.  #Syncope vs. presyncope - We have ruled out CVA, EEG negative, carotid doppler and echo normal. Likely over-medication as Ann [name] 90 xanax pills were gone within 20 days of filling (supposed to last 1 full month). Talked with Ann [name] and Ann [name] husband about judicious use and tapering down. Also concerning is that Ann [name] UDS was negative for benzos or opiates although Ann [name] bottle was empty. She denies that anyone else takes Ann [name] medicines. We will forward our discharge summary and [place] Controlled Substance database inquiry to PCP on discharge.  -Medically stable for discharge to home - Will discontinue amitriptyline on discharge as she is not taking it properly - Will start pravastatin 40 on discharge given LDL of 189 - Will start daily ASA on discharge - PT recommended outpatient PT and supervision for mobility/OOB. She reported at least 6 falls in the past 6 months to them.  #Fall against object - Causing left sided soreness, reproducable on palpation. Troponin negative. Husband describes fall into furniture in that exact area.  - Tylenol prn pain  #COPD - Has not used inhalers in months. Not on home O2. Currently everyday smoker. Advised to quit.  #DVT PPX - subq heparin  Dispo: Disposition is deferred at this time, awaiting improvement of current medical problems.  Anticipated discharge in approximately 0 day(s).   The patient does have a current PCP (Pcp Not In System) and does need an [facility] hospital follow-up appointment after discharge.  The patient does not have transportation limitations that hinder transportation to clinic appointments.  .Services Needed at time of discharge: Y = Yes, Blank = No PT:   OT:   RN:   Equipment:   Other:     LOS: 2 days   Judie Bonus, MD 10/18/2012, 11:23 AM

## 2012-10-18 NOTE — Progress Notes (Deleted)
Name: Ann [name] MRN: [id number] DOB: 12/30/1955 57 y.o. PCP: Pcp Not In System  Date of Admission: 10/16/2012  9:04 AM Date of Discharge: 10/18/2012 Attending Physician: Dr. Josem Kaufmann  Discharge Diagnosis: Principal Problem:   Slurred speech Active Problems:   Fall against object   History of benign cardiac tumor   COPD (chronic obstructive pulmonary disease)  Discharge Medications:   Medication List    STOP taking these medications       acetaminophen 500 MG tablet  Commonly known as:  TYLENOL     ondansetron 8 MG tablet  Commonly known as:  ZOFRAN      TAKE these medications       ALPRAZolam 1 MG tablet  Commonly known as:  XANAX  Take 1 tablet (1 mg total) by mouth daily as needed for anxiety (anxiety).     aspirin 81 MG EC tablet  Take 1 tablet (81 mg total) by mouth daily.     atorvastatin 40 MG tablet  Commonly known as:  LIPITOR  Take 40 mg by mouth at bedtime.     HYDROcodone-acetaminophen 10-325 MG per tablet  Commonly known as:  NORCO  Take 1 tablet by mouth 2 (two) times daily as needed for pain.     multivitamin with minerals Tabs tablet  Take 1 tablet by mouth daily.        Disposition and follow-up:   [name] was discharged from [facility] in Stable condition.  At the hospital follow up visit please address:  1.  Please titrate down her xanax and add an SSRI if able.   2.  Labs / imaging needed at time of follow-up: none  3.  Pending labs/ test needing follow-up: none  Follow-up Appointments:     Follow-up Information   Follow up with Dr. Allegra Lai On 10/23/2012. (11:30 )    Contact information:   [address], [place], [place] [address] [phone]      Discharge Instructions: Discharge Orders   Future Orders Complete By Expires   Call MD for:  difficulty breathing, headache or visual disturbances  As directed    Call MD for:  persistant dizziness or light-headedness  As directed    Diet - low sodium heart  healthy  As directed    Increase activity slowly  As directed       Consultations:  Neurology  Procedures Performed:  Dg Chest 2 View  10/16/2012   *RADIOLOGY REPORT*  Clinical Data: , stroke  CHEST - 2 VIEW  Comparison: None.  Findings: The heart and pulmonary vascularity are within normal limits.  Mild interstitial changes are noted bilaterally without focal infiltrate.  Radiopaque leads are noted over the chest.  IMPRESSION: No acute abnormality is noted.   Original Report Authenticated By: Alcide Clever, M.D.   Ct Head Wo Contrast  10/16/2012   **ADDENDUM** CREATED: 10/16/2012 09:28:58  These results were called by telephone on October 16, 2012 at 09:24 a.m. to Dr. Petra Kuba, who verbally acknowledged these results.  **END ADDENDUM** SIGNED BY: Rutherford Guys. Margarita Grizzle, M.D.  10/16/2012   *RADIOLOGY REPORT*  Clinical Data: Left-sided weakness; slurring of speech  CT HEAD WITHOUT CONTRAST  Technique:  Contiguous axial images were obtained from the base of the skull through the vertex without contrast. Study was obtained within 24 hours of patient arrival at the emergency department  Comparison: None.  Findings:  There is age-related volume loss.  There is an area of decreased attenuation in the  posterior mid right cerebellum measuring 1.7 x 1.4 cm, suspicious for recent infarct.  There is no other evidence suggesting acute infarct.  There is patchy small vessel disease in the centra semiovale bilaterally, more on the left than on the right.  There is no appreciable mass effect, hemorrhage, extra-axial fluid collection, midline shift.  Bony calvarium appears intact.  The mastoid air cells are clear.  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  There is age-related volume loss with patchy supratentorial small vessel disease.  No hemorrhage or appreciable mass effect.  Original Report Authenticated By: Bretta Bang, M.D.   Mr [facility] Wo Contrast  10/16/2012    *RADIOLOGY REPORT*  Clinical Data:  Left-sided weakness and slurred speech.  Stroke. Abnormal CT of the head.  MRI HEAD WITHOUT AND WITH CONTRAST MRA HEAD WITHOUT CONTRAST MRA NECK WITHOUT AND WITH CONTRAST  Technique:  Multiplanar, multiecho pulse sequences of the brain and surrounding structures were obtained without and with intravenous contrast.  Angiographic images of the Circle of Willis were obtained using MRA technique without intravenous contrast. Angiographic images of the neck were obtained using MRA technique without and with intravenous contrast.  Carotid stenosis measurements (when applicable) are obtained utilizing NASCET criteria, using the distal internal carotid diameter as the denominator.  Contrast: 15mL MULTIHANCE GADOBENATE DIMEGLUMINE 529 MG/ML IV SOLN  Comparison:  CT head without contrast 10/16/2012.  MRI HEAD  Findings:   The diffusion weighted images demonstrate no evidence for acute or subacute infarction.  Focal prominence of the sulci accounts for the abnormal CT finding.  This may be related to a remote infarct.  There is a similar, slightly less prominent, area on the left.  Periventricular white matter changes are more prominent left than right.  These are advanced for age.  No significant white matter changes are evident within the brain stem. Flow is present in the major intracranial arteries.  The globes orbits are intact.  The paranasal sinuses and mastoid air cells are clear.  The postcontrast images demonstrate no focal areas of pathologic enhancement.  IMPRESSION: 1.  Focal prominence of the sulci may be related to remote ischemia in the cerebellum bilaterally, right greater than left.  This accounts for the CT finding. 2.  No acute intracranial abnormality. 3.  Age advanced periventricular white matter changes bilaterally. The finding is nonspecific but can be seen in the setting of chronic microvascular ischemia, a demyelinating process such as multiple sclerosis,  vasculitis, complicated migraine headaches, or as the sequelae of a prior infectious or inflammatory process.  MRA HEAD  Findings: The internal carotid arteries are within normal limits from high cervical segments through the ICA termini bilaterally. The A1 and M1 segments are normal.  The anterior communicating artery is patent.  The MCA bifurcations are within normal limits. There is mild irregularity of distal small vessels bilaterally.  No significant proximal stenosis or occlusion is evident. An infundibulum is evident at the left posterior communicating artery.  The left vertebral artery is the dominant vessel.  The right vertebral artery essentially terminates at the PICA.  The basilar artery is within normal limits.  The right posterior cerebral artery is of fetal type.  A smaller left posterior communicating artery is present.  The PCA branch vessels are within normal limits bilaterally.  IMPRESSION:  1.  Mild distal small vessel disease within the anterior circulation.  This corresponds to the age advanced white matter disease. 2.  Normal variant circulation  without other significant proximal stenosis, aneurysm, or branch vessel occlusion.  MRA NECK  Findings: Time-of-flight images demonstrate no significant flow disturbance at either carotid bifurcation.  The postcontrast images demonstrate a standard three-vessel arch configuration.  Both vertebral arteries originate from the subclavian arteries.  There is a moderate stenosis in the proximal left vertebral artery, the dominant vessel.  No significant stenoses are present in the remainder of the vertebral arteries. There is a mild to moderate stenosis of the left subclavian artery beyond the vertebral artery.  The innominate artery and right common carotid artery are within normal limits.  The bifurcation is unremarkable.  The right internal carotid artery is normal.  There is slight signal loss in the proximal left vertebral common carotid artery which  is likely artifactual.  The bifurcation is within normal limits.  The left internal carotid artery is normal.  IMPRESSION:  1.  Moderate proximal left vertebral artery stenosis, the dominant vessel, without additional stenoses. 2.  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 3.  Normal appearance of the anterior circulation appear   Original Report Authenticated By: Marin Roberts, M.D.   Mr Angiogram Neck W Wo Contrast  10/16/2012   *RADIOLOGY REPORT*  Clinical Data:  Left-sided weakness and slurred speech.  Stroke. Abnormal CT of the head.  MRI HEAD WITHOUT AND WITH CONTRAST MRA HEAD WITHOUT CONTRAST MRA NECK WITHOUT AND WITH CONTRAST  Technique:  Multiplanar, multiecho pulse sequences of the brain and surrounding structures were obtained without and with intravenous contrast.  Angiographic images of the Circle of Willis were obtained using MRA technique without intravenous contrast. Angiographic images of the neck were obtained using MRA technique without and with intravenous contrast.  Carotid stenosis measurements (when applicable) are obtained utilizing NASCET criteria, using the distal internal carotid diameter as the denominator.  Contrast: 15mL MULTIHANCE GADOBENATE DIMEGLUMINE 529 MG/ML IV SOLN  Comparison:  CT head without contrast 10/16/2012.  MRI HEAD  Findings:   The diffusion weighted images demonstrate no evidence for acute or subacute infarction.  Focal prominence of the sulci accounts for the abnormal CT finding.  This may be related to a remote infarct.  There is a similar, slightly less prominent, area on the left.  Periventricular white matter changes are more prominent left than right.  These are advanced for age.  No significant white matter changes are evident within the brain stem. Flow is present in the major intracranial arteries.  The globes orbits are intact.  The paranasal sinuses and mastoid air cells are clear.  The postcontrast images  demonstrate no focal areas of pathologic enhancement.  IMPRESSION: 1.  Focal prominence of the sulci may be related to remote ischemia in the cerebellum bilaterally, right greater than left.  This accounts for the CT finding. 2.  No acute intracranial abnormality. 3.  Age advanced periventricular white matter changes bilaterally. The finding is nonspecific but can be seen in the setting of chronic microvascular ischemia, a demyelinating process such as multiple sclerosis, vasculitis, complicated migraine headaches, or as the sequelae of a prior infectious or inflammatory process.  MRA HEAD  Findings: The internal carotid arteries are within normal limits from high cervical segments through the ICA termini bilaterally. The A1 and M1 segments are normal.  The anterior communicating artery is patent.  The MCA bifurcations are within normal limits. There is mild irregularity of distal small vessels bilaterally.  No significant proximal stenosis or occlusion is evident. An infundibulum is evident at  the left posterior communicating artery.  The left vertebral artery is the dominant vessel.  The right vertebral artery essentially terminates at the PICA.  The basilar artery is within normal limits.  The right posterior cerebral artery is of fetal type.  A smaller left posterior communicating artery is present.  The PCA branch vessels are within normal limits bilaterally.  IMPRESSION:  1.  Mild distal small vessel disease within the anterior circulation.  This corresponds to the age advanced white matter disease. 2.  Normal variant circulation without other significant proximal stenosis, aneurysm, or branch vessel occlusion.  MRA NECK  Findings: Time-of-flight images demonstrate no significant flow disturbance at either carotid bifurcation.  The postcontrast images demonstrate a standard three-vessel arch configuration.  Both vertebral arteries originate from the subclavian arteries.  There is a moderate stenosis in the  proximal left vertebral artery, the dominant vessel.  No significant stenoses are present in the remainder of the vertebral arteries. There is a mild to moderate stenosis of the left subclavian artery beyond the vertebral artery.  The innominate artery and right common carotid artery are within normal limits.  The bifurcation is unremarkable.  The right internal carotid artery is normal.  There is slight signal loss in the proximal left vertebral common carotid artery which is likely artifactual.  The bifurcation is within normal limits.  The left internal carotid artery is normal.  IMPRESSION:  1.  Moderate proximal left vertebral artery stenosis, the dominant vessel, without additional stenoses. 2.  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 3.  Normal appearance of the anterior circulation appear   Original Report Authenticated By: Marin Roberts, M.D.   Mr [facility] Wo Contrast  10/16/2012   *RADIOLOGY REPORT*  Clinical Data:  Left-sided weakness and slurred speech.  Stroke. Abnormal CT of the head.  MRI HEAD WITHOUT AND WITH CONTRAST MRA HEAD WITHOUT CONTRAST MRA NECK WITHOUT AND WITH CONTRAST  Technique:  Multiplanar, multiecho pulse sequences of the brain and surrounding structures were obtained without and with intravenous contrast.  Angiographic images of the Circle of Willis were obtained using MRA technique without intravenous contrast. Angiographic images of the neck were obtained using MRA technique without and with intravenous contrast.  Carotid stenosis measurements (when applicable) are obtained utilizing NASCET criteria, using the distal internal carotid diameter as the denominator.  Contrast: 15mL MULTIHANCE GADOBENATE DIMEGLUMINE 529 MG/ML IV SOLN  Comparison:  CT head without contrast 10/16/2012.  MRI HEAD  Findings:   The diffusion weighted images demonstrate no evidence for acute or subacute infarction.  Focal prominence of the sulci accounts for the  abnormal CT finding.  This may be related to a remote infarct.  There is a similar, slightly less prominent, area on the left.  Periventricular white matter changes are more prominent left than right.  These are advanced for age.  No significant white matter changes are evident within the brain stem. Flow is present in the major intracranial arteries.  The globes orbits are intact.  The paranasal sinuses and mastoid air cells are clear.  The postcontrast images demonstrate no focal areas of pathologic enhancement.  IMPRESSION: 1.  Focal prominence of the sulci may be related to remote ischemia in the cerebellum bilaterally, right greater than left.  This accounts for the CT finding. 2.  No acute intracranial abnormality. 3.  Age advanced periventricular white matter changes bilaterally. The finding is nonspecific but can be seen in the setting of chronic microvascular ischemia,  a demyelinating process such as multiple sclerosis, vasculitis, complicated migraine headaches, or as the sequelae of a prior infectious or inflammatory process.  MRA HEAD  Findings: The internal carotid arteries are within normal limits from high cervical segments through the ICA termini bilaterally. The A1 and M1 segments are normal.  The anterior communicating artery is patent.  The MCA bifurcations are within normal limits. There is mild irregularity of distal small vessels bilaterally.  No significant proximal stenosis or occlusion is evident. An infundibulum is evident at the left posterior communicating artery.  The left vertebral artery is the dominant vessel.  The right vertebral artery essentially terminates at the PICA.  The basilar artery is within normal limits.  The right posterior cerebral artery is of fetal type.  A smaller left posterior communicating artery is present.  The PCA branch vessels are within normal limits bilaterally.  IMPRESSION:  1.  Mild distal small vessel disease within the anterior circulation.  This  corresponds to the age advanced white matter disease. 2.  Normal variant circulation without other significant proximal stenosis, aneurysm, or branch vessel occlusion.  MRA NECK  Findings: Time-of-flight images demonstrate no significant flow disturbance at either carotid bifurcation.  The postcontrast images demonstrate a standard three-vessel arch configuration.  Both vertebral arteries originate from the subclavian arteries.  There is a moderate stenosis in the proximal left vertebral artery, the dominant vessel.  No significant stenoses are present in the remainder of the vertebral arteries. There is a mild to moderate stenosis of the left subclavian artery beyond the vertebral artery.  The innominate artery and right common carotid artery are within normal limits.  The bifurcation is unremarkable.  The right internal carotid artery is normal.  There is slight signal loss in the proximal left vertebral common carotid artery which is likely artifactual.  The bifurcation is within normal limits.  The left internal carotid artery is normal.  IMPRESSION:  1.  Moderate proximal left vertebral artery stenosis, the dominant vessel, without additional stenoses. 2.  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 3.  Normal appearance of the anterior circulation appear   Original Report Authenticated By: Marin Roberts, M.D.    2D Echo:  Study Conclusions  - Left ventricle: The cavity size was normal. Wall thickness was normal. Systolic function was normal. The estimated ejection fraction was in the range of 55% to 60%. Wall motion was normal; there were no regional wall motion abnormalities. Left ventricular diastolic function parameters were normal. - Mitral valve: Mild regurgitation.  Carotid dopplers: Summary:  - The vertebral arteries appear patent with antegrade flow. - Findings consistent with1- 39 percent stenosis involving the right internal carotid  artery and the left internal carotid artery. - ICA/CCA ratio. right =1.34. left =0.85. Other specific details can be found in the table(s) above. Prepared and Electronically Authenticated by  EEG: negative for seizure-like activity, normal brain function  Admission HPI:  Mrs. Ann [name] is a 57 y.o. woman PMH COPD, benign cardiac tumor, anxiety disorder who presents with history of a fall and slurred speech.  The patient was in her normal state of health until 8am this morning, making coffee in the kitchen, when she collapsed. Her husband was with her and able to catch her so that she did not hit her head. She did however fall onto a piece of furniture, hitting the left side of her chest. The husband states she was responsive to him the whole time, so he  does not think she lost consciousness. However that patient herself remembers nothing of the event, nor the EMS ride into the hospital. Patient and husband deny aura, palpitations, chest pain, diaphoresis, confusion before she fell. After she fell, no bowel or bladder incontinence. No jerking movements of arms and legs. No tongue biting. Per first responders she had left sided weakness, disorientation and dysarthria at the scene. On the ride in, EMS reported normal grip strength, but persistent disorientation and dysarthria, though improving. Currently the patient is alert and oriented and denies weakness, numbness, dysarthria, word-finding difficulties. This has never happened to her before.   She is on multiple sedating medications including Xanax and Norco. She says she takes 2-3 Xanax daily, amitriptyline about once a week at night "when I feel like it". She states she has a prescription for Norco but only takes it very rarely, with no use in the past few weeks. She brings 3 medicine bottles with her. One is for Xanax 1mg  TID, #90, dispensed on 09/26/12, with zero pills left. Another is for amitriptyline 50 qd, the dispense date was in 2013. She also  brings a bottle of Norco 7.5-325 once daily prn, #30, with ~10 pills left, dispense date in April of this year.  We ran an inquiry on her into the narcotic database:  - On 09/26/12 she filled xanax 1mg  #90 and oxycodone-acetaminophen 10-325 #30.  - On 09/01/12 she filled xanax 2mg  #90 and hydrocodone-acetaminophen 10-325 #30.  - On 08/04/12 she filled xanax 2mg  #90 and hydrocodone-acetaminophen 10-325 #30.  - On 07/06/12 she filled xanax 2mg  #90 and hydrocodone-acetaminophen 10-325 #30.  She gets her medications from Dr. Allegra Lai in [place], [place], [phone]. Her husband owns a trucking company and they split their time between [place]. They travel a lot. She and her husband just returned from a road trip last night, getting in at 3am.  She has smoked 1PPD since she was a girl. Denies alcohol or drug use.  Hospital Course by problem list:  Slurred speech - The patient was having some slurred speech prior to the pre-syncopal episode and did have resolution of these symptoms within 1-2 hours. She denied any ingestion of non-prescription medications. She also denied the usage of her own prescription medications and UDS was negative for benzos or opiates. However there was some suspicious behavior as the medication bottle that was filled on 09/26/12 was empty of the 90 pills of 1 mg xanax and it was only 20 days into that time period although the patient claimed she did not take it more than prescribed and sometimes not even as often as it was prescribed. She did not have recurrence of these symptoms while she was in the hospital and our leading diagnosis was TIA versus medication misuse. She did not have any episodes on telemetry during her stay, she did have negative CE, normal Echo, carotid dopplers without significant disease, lipid panel which indicated she should be on a statin (LDL 189) and HgA1c 5.6. She was discharged on ASA 81 mg daily and pravastatin 40 mg daily and advised to try to  gradually decrease the amount and dosages of the xanax she was taking. She was also counseled about smoking cessation during this hospital admission.   Fall against object - The patient had likely a pre-syncopal episode the day of admission and fell into a wardrobe. She did have a focal area of tenderness on her left side where the dresser hit her. She was evaluated  by PT and OT while in the hospital and they recommended a course of home PT which the patient declined as she felt confident getting around the hospital without assistance.   History of benign cardiac tumor - Echo done during hospital stay was without masses and the patient states that this was found about 1-2 years prior to admission.   COPD (chronic obstructive pulmonary disease) - Unclear of this diagnosis however the patient states she has only an albuterol inhaler which she uses infrequently (and her husband uses more than she does). She did not need any rescue inhaler while in the hospital. She was advised about smoking cessation and she does not see the need to quit at this time.   Discharge Vitals:   BP 180/56  Pulse 86  Temp(Src) 98.1 F (36.7 C) (Oral)  Resp 18  Ht 5\' 4"  (1.626 m)  Wt 130 lb (58.968 kg)  BMI 22.3 kg/m2  SpO2 98%  Discharge Labs:  Lab Results  Component Value Date   WBC 7.5 10/16/2012   HGB 13.6 10/16/2012   HCT 40.0 10/16/2012   MCV 90.4 10/16/2012   PLT 210 10/16/2012   Lab Results  Component Value Date   HGBA1C 5.6 10/16/2012   Lab Results  Component Value Date   CHOL 303* 10/17/2012   HDL 88 10/17/2012   LDLCALC 161* 10/17/2012   TRIG 128 10/17/2012   CHOLHDL 3.4 10/17/2012   Lab Results  Component Value Date   CREATININE 0.80 10/16/2012   BUN 14 10/16/2012   NA 137 10/16/2012   K 3.9 10/16/2012   CL 104 10/16/2012   CO2 20 10/16/2012   Signed: Judie Bonus, MD 10/18/2012, 11:51 AM   Time Spent on Discharge: 25 minutes Services Ordered on Discharge: none Equipment Ordered on Discharge: none

## 2012-10-18 NOTE — Progress Notes (Signed)
Utilization Review completed.  

## 2012-10-20 NOTE — Discharge Summary (Signed)
Internal Medicine Teaching Service Discharge Summary  Name: Ann [name] MRN: [id number] DOB: [birth year] 57 y.o. PCP: Pcp Not In System  Date of Admission: 10/16/2012  9:04 AM Date of Discharge: 10/18/2012 Attending Physician: Dr. Josem Kaufmann  Discharge Diagnosis: Principal Problem:   Slurred speech Active Problems:   Fall against object   History of benign cardiac tumor   COPD (chronic obstructive pulmonary disease)  Discharge Medications:   Medication List    STOP taking these medications       acetaminophen 500 MG tablet  Commonly known as:  TYLENOL     ondansetron 8 MG tablet  Commonly known as:  ZOFRAN      TAKE these medications       ALPRAZolam 1 MG tablet  Commonly known as:  XANAX  Take 1 tablet (1 mg total) by mouth daily as needed for anxiety (anxiety).     aspirin 81 MG EC tablet  Take 1 tablet (81 mg total) by mouth daily.     atorvastatin 40 MG tablet  Commonly known as:  LIPITOR  Take 40 mg by mouth at bedtime.     HYDROcodone-acetaminophen 10-325 MG per tablet  Commonly known as:  NORCO  Take 1 tablet by mouth 2 (two) times daily as needed for pain.     multivitamin with minerals Tabs tablet  Take 1 tablet by mouth daily.        Disposition and follow-up:   [name] was discharged from [facility] in Stable condition.  At the hospital follow up visit please address:  1.  Please titrate down her xanax and add an SSRI if able.   2.  Labs / imaging needed at time of follow-up: none  3.  Pending labs/ test needing follow-up: none  Follow-up Appointments:     Follow-up Information   Follow up with Dr. Allegra Lai On 10/23/2012. (11:30 )    Contact information:   [address], [place], [place] [address] [phone]      Discharge Instructions: Discharge Orders   Future Orders Complete By Expires   Call MD for:  difficulty breathing, headache or visual disturbances  As directed    Call MD for:  persistant dizziness or  light-headedness  As directed    Diet - low sodium heart healthy  As directed    Increase activity slowly  As directed       Consultations:  Neurology  Procedures Performed:  Dg Chest 2 View  10/16/2012   *RADIOLOGY REPORT*  Clinical Data: , stroke  CHEST - 2 VIEW  Comparison: None.  Findings: The heart and pulmonary vascularity are within normal limits.  Mild interstitial changes are noted bilaterally without focal infiltrate.  Radiopaque leads are noted over the chest.  IMPRESSION: No acute abnormality is noted.   Original Report Authenticated By: Alcide Clever, M.D.   Ct Head Wo Contrast  10/16/2012   **ADDENDUM** CREATED: 10/16/2012 09:28:58  These results were called by telephone on October 16, 2012 at 09:24 a.m. to Dr. Petra Kuba, who verbally acknowledged these results.  **END ADDENDUM** SIGNED BY: Rutherford Guys. Margarita Grizzle, M.D.  10/16/2012   *RADIOLOGY REPORT*  Clinical Data: Left-sided weakness; slurring of speech  CT HEAD WITHOUT CONTRAST  Technique:  Contiguous axial images were obtained from the base of the skull through the vertex without contrast. Study was obtained within 24 hours of patient arrival at the emergency department  Comparison: None.  Findings:  There is age-related volume loss.  There is an  area of decreased attenuation in the posterior mid right cerebellum measuring 1.7 x 1.4 cm, suspicious for recent infarct.  There is no other evidence suggesting acute infarct.  There is patchy small vessel disease in the centra semiovale bilaterally, more on the left than on the right.  There is no appreciable mass effect, hemorrhage, extra-axial fluid collection, midline shift.  Bony calvarium appears intact.  The mastoid air cells are clear.  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  There is age-related volume loss with patchy supratentorial small vessel disease.  No hemorrhage or appreciable mass effect.  Original Report Authenticated By:  Bretta Bang, M.D.   Mr [facility] Wo Contrast  10/16/2012   *RADIOLOGY REPORT*  Clinical Data:  Left-sided weakness and slurred speech.  Stroke. Abnormal CT of the head.  MRI HEAD WITHOUT AND WITH CONTRAST MRA HEAD WITHOUT CONTRAST MRA NECK WITHOUT AND WITH CONTRAST  Technique:  Multiplanar, multiecho pulse sequences of the brain and surrounding structures were obtained without and with intravenous contrast.  Angiographic images of the Circle of Willis were obtained using MRA technique without intravenous contrast. Angiographic images of the neck were obtained using MRA technique without and with intravenous contrast.  Carotid stenosis measurements (when applicable) are obtained utilizing NASCET criteria, using the distal internal carotid diameter as the denominator.  Contrast: 15mL MULTIHANCE GADOBENATE DIMEGLUMINE 529 MG/ML IV SOLN  Comparison:  CT head without contrast 10/16/2012.  MRI HEAD  Findings:   The diffusion weighted images demonstrate no evidence for acute or subacute infarction.  Focal prominence of the sulci accounts for the abnormal CT finding.  This may be related to a remote infarct.  There is a similar, slightly less prominent, area on the left.  Periventricular white matter changes are more prominent left than right.  These are advanced for age.  No significant white matter changes are evident within the brain stem. Flow is present in the major intracranial arteries.  The globes orbits are intact.  The paranasal sinuses and mastoid air cells are clear.  The postcontrast images demonstrate no focal areas of pathologic enhancement.  IMPRESSION: 1.  Focal prominence of the sulci may be related to remote ischemia in the cerebellum bilaterally, right greater than left.  This accounts for the CT finding. 2.  No acute intracranial abnormality. 3.  Age advanced periventricular white matter changes bilaterally. The finding is nonspecific but can be seen in the setting of chronic microvascular  ischemia, a demyelinating process such as multiple sclerosis, vasculitis, complicated migraine headaches, or as the sequelae of a prior infectious or inflammatory process.  MRA HEAD  Findings: The internal carotid arteries are within normal limits from high cervical segments through the ICA termini bilaterally. The A1 and M1 segments are normal.  The anterior communicating artery is patent.  The MCA bifurcations are within normal limits. There is mild irregularity of distal small vessels bilaterally.  No significant proximal stenosis or occlusion is evident. An infundibulum is evident at the left posterior communicating artery.  The left vertebral artery is the dominant vessel.  The right vertebral artery essentially terminates at the PICA.  The basilar artery is within normal limits.  The right posterior cerebral artery is of fetal type.  A smaller left posterior communicating artery is present.  The PCA branch vessels are within normal limits bilaterally.  IMPRESSION:  1.  Mild distal small vessel disease within the anterior circulation.  This corresponds to the age advanced white matter  disease. 2.  Normal variant circulation without other significant proximal stenosis, aneurysm, or branch vessel occlusion.  MRA NECK  Findings: Time-of-flight images demonstrate no significant flow disturbance at either carotid bifurcation.  The postcontrast images demonstrate a standard three-vessel arch configuration.  Both vertebral arteries originate from the subclavian arteries.  There is a moderate stenosis in the proximal left vertebral artery, the dominant vessel.  No significant stenoses are present in the remainder of the vertebral arteries. There is a mild to moderate stenosis of the left subclavian artery beyond the vertebral artery.  The innominate artery and right common carotid artery are within normal limits.  The bifurcation is unremarkable.  The right internal carotid artery is normal.  There is slight signal  loss in the proximal left vertebral common carotid artery which is likely artifactual.  The bifurcation is within normal limits.  The left internal carotid artery is normal.  IMPRESSION:  1.  Moderate proximal left vertebral artery stenosis, the dominant vessel, without additional stenoses. 2.  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 3.  Normal appearance of the anterior circulation appear   Original Report Authenticated By: Marin Roberts, M.D.   Mr Angiogram Neck W Wo Contrast  10/16/2012   *RADIOLOGY REPORT*  Clinical Data:  Left-sided weakness and slurred speech.  Stroke. Abnormal CT of the head.  MRI HEAD WITHOUT AND WITH CONTRAST MRA HEAD WITHOUT CONTRAST MRA NECK WITHOUT AND WITH CONTRAST  Technique:  Multiplanar, multiecho pulse sequences of the brain and surrounding structures were obtained without and with intravenous contrast.  Angiographic images of the Circle of Willis were obtained using MRA technique without intravenous contrast. Angiographic images of the neck were obtained using MRA technique without and with intravenous contrast.  Carotid stenosis measurements (when applicable) are obtained utilizing NASCET criteria, using the distal internal carotid diameter as the denominator.  Contrast: 15mL MULTIHANCE GADOBENATE DIMEGLUMINE 529 MG/ML IV SOLN  Comparison:  CT head without contrast 10/16/2012.  MRI HEAD  Findings:   The diffusion weighted images demonstrate no evidence for acute or subacute infarction.  Focal prominence of the sulci accounts for the abnormal CT finding.  This may be related to a remote infarct.  There is a similar, slightly less prominent, area on the left.  Periventricular white matter changes are more prominent left than right.  These are advanced for age.  No significant white matter changes are evident within the brain stem. Flow is present in the major intracranial arteries.  The globes orbits are intact.  The paranasal  sinuses and mastoid air cells are clear.  The postcontrast images demonstrate no focal areas of pathologic enhancement.  IMPRESSION: 1.  Focal prominence of the sulci may be related to remote ischemia in the cerebellum bilaterally, right greater than left.  This accounts for the CT finding. 2.  No acute intracranial abnormality. 3.  Age advanced periventricular white matter changes bilaterally. The finding is nonspecific but can be seen in the setting of chronic microvascular ischemia, a demyelinating process such as multiple sclerosis, vasculitis, complicated migraine headaches, or as the sequelae of a prior infectious or inflammatory process.  MRA HEAD  Findings: The internal carotid arteries are within normal limits from high cervical segments through the ICA termini bilaterally. The A1 and M1 segments are normal.  The anterior communicating artery is patent.  The MCA bifurcations are within normal limits. There is mild irregularity of distal small vessels bilaterally.  No significant proximal stenosis or occlusion is  evident. An infundibulum is evident at the left posterior communicating artery.  The left vertebral artery is the dominant vessel.  The right vertebral artery essentially terminates at the PICA.  The basilar artery is within normal limits.  The right posterior cerebral artery is of fetal type.  A smaller left posterior communicating artery is present.  The PCA branch vessels are within normal limits bilaterally.  IMPRESSION:  1.  Mild distal small vessel disease within the anterior circulation.  This corresponds to the age advanced white matter disease. 2.  Normal variant circulation without other significant proximal stenosis, aneurysm, or branch vessel occlusion.  MRA NECK  Findings: Time-of-flight images demonstrate no significant flow disturbance at either carotid bifurcation.  The postcontrast images demonstrate a standard three-vessel arch configuration.  Both vertebral arteries originate from  the subclavian arteries.  There is a moderate stenosis in the proximal left vertebral artery, the dominant vessel.  No significant stenoses are present in the remainder of the vertebral arteries. There is a mild to moderate stenosis of the left subclavian artery beyond the vertebral artery.  The innominate artery and right common carotid artery are within normal limits.  The bifurcation is unremarkable.  The right internal carotid artery is normal.  There is slight signal loss in the proximal left vertebral common carotid artery which is likely artifactual.  The bifurcation is within normal limits.  The left internal carotid artery is normal.  IMPRESSION:  1.  Moderate proximal left vertebral artery stenosis, the dominant vessel, without additional stenoses. 2.  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 3.  Normal appearance of the anterior circulation appear   Original Report Authenticated By: Marin Roberts, M.D.   Mr [facility] Wo Contrast  10/16/2012   *RADIOLOGY REPORT*  Clinical Data:  Left-sided weakness and slurred speech.  Stroke. Abnormal CT of the head.  MRI HEAD WITHOUT AND WITH CONTRAST MRA HEAD WITHOUT CONTRAST MRA NECK WITHOUT AND WITH CONTRAST  Technique:  Multiplanar, multiecho pulse sequences of the brain and surrounding structures were obtained without and with intravenous contrast.  Angiographic images of the Circle of Willis were obtained using MRA technique without intravenous contrast. Angiographic images of the neck were obtained using MRA technique without and with intravenous contrast.  Carotid stenosis measurements (when applicable) are obtained utilizing NASCET criteria, using the distal internal carotid diameter as the denominator.  Contrast: 15mL MULTIHANCE GADOBENATE DIMEGLUMINE 529 MG/ML IV SOLN  Comparison:  CT head without contrast 10/16/2012.  MRI HEAD  Findings:   The diffusion weighted images demonstrate no evidence for acute or  subacute infarction.  Focal prominence of the sulci accounts for the abnormal CT finding.  This may be related to a remote infarct.  There is a similar, slightly less prominent, area on the left.  Periventricular white matter changes are more prominent left than right.  These are advanced for age.  No significant white matter changes are evident within the brain stem. Flow is present in the major intracranial arteries.  The globes orbits are intact.  The paranasal sinuses and mastoid air cells are clear.  The postcontrast images demonstrate no focal areas of pathologic enhancement.  IMPRESSION: 1.  Focal prominence of the sulci may be related to remote ischemia in the cerebellum bilaterally, right greater than left.  This accounts for the CT finding. 2.  No acute intracranial abnormality. 3.  Age advanced periventricular white matter changes bilaterally. The finding is nonspecific but can be seen in  the setting of chronic microvascular ischemia, a demyelinating process such as multiple sclerosis, vasculitis, complicated migraine headaches, or as the sequelae of a prior infectious or inflammatory process.  MRA HEAD  Findings: The internal carotid arteries are within normal limits from high cervical segments through the ICA termini bilaterally. The A1 and M1 segments are normal.  The anterior communicating artery is patent.  The MCA bifurcations are within normal limits. There is mild irregularity of distal small vessels bilaterally.  No significant proximal stenosis or occlusion is evident. An infundibulum is evident at the left posterior communicating artery.  The left vertebral artery is the dominant vessel.  The right vertebral artery essentially terminates at the PICA.  The basilar artery is within normal limits.  The right posterior cerebral artery is of fetal type.  A smaller left posterior communicating artery is present.  The PCA branch vessels are within normal limits bilaterally.  IMPRESSION:  1.  Mild  distal small vessel disease within the anterior circulation.  This corresponds to the age advanced white matter disease. 2.  Normal variant circulation without other significant proximal stenosis, aneurysm, or branch vessel occlusion.  MRA NECK  Findings: Time-of-flight images demonstrate no significant flow disturbance at either carotid bifurcation.  The postcontrast images demonstrate a standard three-vessel arch configuration.  Both vertebral arteries originate from the subclavian arteries.  There is a moderate stenosis in the proximal left vertebral artery, the dominant vessel.  No significant stenoses are present in the remainder of the vertebral arteries. There is a mild to moderate stenosis of the left subclavian artery beyond the vertebral artery.  The innominate artery and right common carotid artery are within normal limits.  The bifurcation is unremarkable.  The right internal carotid artery is normal.  There is slight signal loss in the proximal left vertebral common carotid artery which is likely artifactual.  The bifurcation is within normal limits.  The left internal carotid artery is normal.  IMPRESSION:  1.  Moderate proximal left vertebral artery stenosis, the dominant vessel, without additional stenoses. 2.  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 3.  Normal appearance of the anterior circulation appear   Original Report Authenticated By: Marin Roberts, M.D.    2D Echo:  Study Conclusions  - Left ventricle: The cavity size was normal. Wall thickness was normal. Systolic function was normal. The estimated ejection fraction was in the range of 55% to 60%. Wall motion was normal; there were no regional wall motion abnormalities. Left ventricular diastolic function parameters were normal. - Mitral valve: Mild regurgitation.  Carotid dopplers: Summary:  - The vertebral arteries appear patent with antegrade flow. - Findings consistent  with1- 39 percent stenosis involving the right internal carotid artery and the left internal carotid artery. - ICA/CCA ratio. right =1.34. left =0.85. Other specific details can be found in the table(s) above. Prepared and Electronically Authenticated by  EEG: negative for seizure-like activity, normal brain function  Admission HPI:  Mrs. Ann [name] is a 57 y.o. woman PMH COPD, benign cardiac tumor, anxiety disorder who presents with history of a fall and slurred speech.  The patient was in her normal state of health until 8am this morning, making coffee in the kitchen, when she collapsed. Her husband was with her and able to catch her so that she did not hit her head. She did however fall onto a piece of furniture, hitting the left side of her chest. The husband states she was responsive to  him the whole time, so he does not think she lost consciousness. However that patient herself remembers nothing of the event, nor the EMS ride into the hospital. Patient and husband deny aura, palpitations, chest pain, diaphoresis, confusion before she fell. After she fell, no bowel or bladder incontinence. No jerking movements of arms and legs. No tongue biting. Per first responders she had left sided weakness, disorientation and dysarthria at the scene. On the ride in, EMS reported normal grip strength, but persistent disorientation and dysarthria, though improving. Currently the patient is alert and oriented and denies weakness, numbness, dysarthria, word-finding difficulties. This has never happened to her before.   She is on multiple sedating medications including Xanax and Norco. She says she takes 2-3 Xanax daily, amitriptyline about once a week at night "when I feel like it". She states she has a prescription for Norco but only takes it very rarely, with no use in the past few weeks. She brings 3 medicine bottles with her. One is for Xanax 1mg  TID, #90, dispensed on 09/26/12, with zero pills left. Another is  for amitriptyline 50 qd, the dispense date was in 2013. She also brings a bottle of Norco 7.5-325 once daily prn, #30, with ~10 pills left, dispense date in April of this year.  We ran an inquiry on her into the narcotic database:  - On 09/26/12 she filled xanax 1mg  #90 and oxycodone-acetaminophen 10-325 #30.  - On 09/01/12 she filled xanax 2mg  #90 and hydrocodone-acetaminophen 10-325 #30.  - On 08/04/12 she filled xanax 2mg  #90 and hydrocodone-acetaminophen 10-325 #30.  - On 07/06/12 she filled xanax 2mg  #90 and hydrocodone-acetaminophen 10-325 #30.  She gets her medications from Dr. Allegra Lai in [place], [place], [phone]. Her husband owns a trucking company and they split their time between [place]. They travel a lot. She and her husband just returned from a road trip last night, getting in at 3am.  She has smoked 1PPD since she was a girl. Denies alcohol or drug use.  Hospital Course by problem list:  Slurred speech - The patient was having some slurred speech prior to the pre-syncopal episode and did have resolution of these symptoms within 1-2 hours. She denied any ingestion of non-prescription medications. She also denied the usage of her own prescription medications and UDS was negative for benzos or opiates. However there was some suspicious behavior as the medication bottle that was filled on 09/26/12 was empty of the 90 pills of 1 mg xanax and it was only 20 days into that time period although the patient claimed she did not take it more than prescribed and sometimes not even as often as it was prescribed. She did not have recurrence of these symptoms while she was in the hospital and our leading diagnosis was TIA versus medication misuse. She did not have any episodes on telemetry during her stay, she did have negative CE, normal Echo, carotid dopplers without significant disease, lipid panel which indicated she should be on a statin (LDL 189) and HgA1c 5.6. She was discharged on ASA  81 mg daily and pravastatin 40 mg daily and advised to try to gradually decrease the amount and dosages of the xanax she was taking. She was also counseled about smoking cessation during this hospital admission.   Fall against object - The patient had likely a pre-syncopal episode the day of admission and fell into a wardrobe. She did have a focal area of tenderness on her left side where the  dresser hit her. She was evaluated by PT and OT while in the hospital and they recommended a course of home PT which the patient declined as she felt confident getting around the hospital without assistance.   History of benign cardiac tumor - Echo done during hospital stay was without masses and the patient states that this was found about 1-2 years prior to admission.   COPD (chronic obstructive pulmonary disease) - Unclear of this diagnosis however the patient states she has only an albuterol inhaler which she uses infrequently (and her husband uses more than she does). She did not need any rescue inhaler while in the hospital. She was advised about smoking cessation and she does not see the need to quit at this time.   Discharge Vitals:   BP 180/56  Pulse 86  Temp(Src) 98.1 F (36.7 C) (Oral)  Resp 18  Ht 5\' 4"  (1.626 m)  Wt 130 lb (58.968 kg)  BMI 22.3 kg/m2  SpO2 98%  Discharge Labs:  Lab Results  Component Value Date   WBC 7.5 10/16/2012   HGB 13.6 10/16/2012   HCT 40.0 10/16/2012   MCV 90.4 10/16/2012   PLT 210 10/16/2012   Lab Results  Component Value Date   HGBA1C 5.6 10/16/2012   Lab Results  Component Value Date   CHOL 303* 10/17/2012   HDL 88 10/17/2012   LDLCALC 147* 10/17/2012   TRIG 128 10/17/2012   CHOLHDL 3.4 10/17/2012   Lab Results  Component Value Date   CREATININE 0.80 10/16/2012   BUN 14 10/16/2012   NA 137 10/16/2012   K 3.9 10/16/2012   CL 104 10/16/2012   CO2 20 10/16/2012   Signed: Judie Bonus, MD 10/18/2012, 11:51 AM   Time Spent on Discharge: 25 minutes Services Ordered on  Discharge: none Equipment Ordered on Discharge: none

## 2012-11-07 ENCOUNTER — Emergency Department (HOSPITAL_COMMUNITY): Payer: Medicare HMO

## 2012-11-07 ENCOUNTER — Emergency Department (HOSPITAL_COMMUNITY)
Admission: EM | Admit: 2012-11-07 | Discharge: 2012-11-07 | Disposition: A | Discharge status: Home / Self Care | Payer: Medicare HMO | Attending: Emergency Medicine | Admitting: Emergency Medicine

## 2012-11-07 ENCOUNTER — Encounter (HOSPITAL_COMMUNITY): Payer: Self-pay

## 2012-11-07 DIAGNOSIS — F19239 Other psychoactive substance dependence with withdrawal, unspecified: Secondary | ICD-10-CM

## 2012-11-07 DIAGNOSIS — Z792 Long term (current) use of antibiotics: Secondary | ICD-10-CM | POA: Insufficient documentation

## 2012-11-07 DIAGNOSIS — I2510 Atherosclerotic heart disease of native coronary artery without angina pectoris: Secondary | ICD-10-CM | POA: Insufficient documentation

## 2012-11-07 DIAGNOSIS — Z79899 Other long term (current) drug therapy: Secondary | ICD-10-CM | POA: Insufficient documentation

## 2012-11-07 DIAGNOSIS — J159 Unspecified bacterial pneumonia: Secondary | ICD-10-CM | POA: Insufficient documentation

## 2012-11-07 DIAGNOSIS — Z8742 Personal history of other diseases of the female genital tract: Secondary | ICD-10-CM | POA: Insufficient documentation

## 2012-11-07 DIAGNOSIS — J189 Pneumonia, unspecified organism: Secondary | ICD-10-CM

## 2012-11-07 DIAGNOSIS — R112 Nausea with vomiting, unspecified: Secondary | ICD-10-CM | POA: Insufficient documentation

## 2012-11-07 DIAGNOSIS — R197 Diarrhea, unspecified: Secondary | ICD-10-CM | POA: Insufficient documentation

## 2012-11-07 DIAGNOSIS — R5381 Other malaise: Secondary | ICD-10-CM | POA: Insufficient documentation

## 2012-11-07 DIAGNOSIS — F19939 Other psychoactive substance use, unspecified with withdrawal, unspecified: Secondary | ICD-10-CM | POA: Insufficient documentation

## 2012-11-07 DIAGNOSIS — IMO0002 Reserved for concepts with insufficient information to code with codable children: Secondary | ICD-10-CM | POA: Insufficient documentation

## 2012-11-07 DIAGNOSIS — Z951 Presence of aortocoronary bypass graft: Secondary | ICD-10-CM | POA: Insufficient documentation

## 2012-11-07 DIAGNOSIS — R51 Headache: Secondary | ICD-10-CM | POA: Insufficient documentation

## 2012-11-07 DIAGNOSIS — F17200 Nicotine dependence, unspecified, uncomplicated: Secondary | ICD-10-CM | POA: Insufficient documentation

## 2012-11-07 LAB — COMPREHENSIVE METABOLIC PANEL
Albumin: 4.1 g/dL (ref 3.5–5.2)
BUN: 12 mg/dL (ref 6–23)
Calcium: 9.5 mg/dL (ref 8.4–10.5)
Creatinine, Ser: 0.9 mg/dL (ref 0.50–1.10)
Total Bilirubin: 0.3 mg/dL (ref 0.3–1.2)
Total Protein: 7.6 g/dL (ref 6.0–8.3)

## 2012-11-07 LAB — CBC
HCT: 46.5 % — ABNORMAL HIGH (ref 36.0–46.0)
MCH: 30.3 pg (ref 26.0–34.0)
MCHC: 34.2 g/dL (ref 30.0–36.0)
MCV: 88.7 fL (ref 78.0–100.0)
Platelets: 282 10*3/uL (ref 150–400)
RDW: 14 % (ref 11.5–15.5)

## 2012-11-07 LAB — ETHANOL: Alcohol, Ethyl (B): <11 mg/dL (ref 0–11)

## 2012-11-07 LAB — POCT I-STAT TROPONIN I: Troponin i, poc: 0.03 ng/mL (ref 0.00–0.08)

## 2012-11-07 LAB — ACETAMINOPHEN LEVEL: Acetaminophen (Tylenol), Serum: <15 ug/mL (ref 10–30)

## 2012-11-07 MED ORDER — LORAZEPAM 2 MG/ML IJ SOLN
1.0000 mg/h | INTRAVENOUS | Status: DC
  Administered 2012-11-07: 2 mg/h via INTRAVENOUS
  Filled 2012-11-07: qty 25

## 2012-11-07 MED ORDER — SODIUM CHLORIDE 0.9 % IV SOLN
INTRAVENOUS | Status: AC
  Administered 2012-11-07: 11:00:00 via INTRAVENOUS

## 2012-11-07 MED ORDER — LEVOFLOXACIN 750 MG PO TABS: 750.0000 mg | ORAL_TABLET | Freq: Every day | ORAL | Status: DC

## 2012-11-07 NOTE — ED Notes (Addendum)
Per EMS, Pt c/o generalized abdominal pain, emesis and diarrhea x 2 days.  Pain score 8/10.  Pt sts "It's been 2 weeks since I've had anything to eat, expect Coke and cigarettes."  Sts she has not had her medications for 9 days.  Sts medications were filled for a 90 days supply on 9/15 and they were "gone in two days.  Xanax is the only thing that helps me sleep."   Pt was orthostatic en route.  A  & Ox4.  4mg  given en route.       Pt sts "I dont sleep, because I'm a drug addict and I cant control it anymore.  I have to have xanax to sleep."

## 2012-11-07 NOTE — ED Notes (Signed)
Pt escorted to discharge window. Verbalized understanding discharge instructions. In no acute distress.   

## 2012-11-07 NOTE — ED Notes (Signed)
MD at bedside. 

## 2012-11-07 NOTE — ED Provider Notes (Addendum)
I saw and evaluated the patient, reviewed the resident's note and I agree with the findings and plan.   .Face to face Exam:  General:  Awake HEENT:  Atraumatic Resp:  Normal effort Abd:  Nondistended Neuro:No focal weakness  Nelia Shi, MD 11/07/12 1604  I saw and reviewed the EKG interpretation of the resident and agree with the findings.    Nelia Shi, MD 01/16/13 2142

## 2012-11-07 NOTE — ED Provider Notes (Signed)
CSN: [id number]     Arrival date & time 11/07/12  1478 History   First MD Initiated Contact with Patient 11/07/12 [phone]     Chief Complaint  Patient presents with  . Abdominal Pain  . Emesis  . Diarrhea   HPI Pt is a 57 y/o female with PMHx of CAD, polysubstance abuse on xanax for the last 30 yrs, taking hydrocodone for pain who presents today with worsening weakness and wanting help for her substance abuse.  Pt states she has taken amitriptyline, xanxa, and hydrocodone to help her sleep and "has tried everything in the past to make her sleep".  Has been on xanax for over 30 yrs and states she recently had her pills refilled on 9/15 and had taken all of her xanax (90 pills), amitryptyline (90 pills), and hydrocodone (30 pills) over the following two-three days.  Since that point, she is c/o weakness, nausea, vomiting, diarrhea, and progressive weakness to the point today where she had her daughter in law bring her to the hospital for substance abuse and help.  Pt continues to fixate on "being head of her household, not being able to sleep without drugs, wanting to get help for her substance abuse but does not want to go to rehab".  She denies any CP, chest pressure, pain into her scapular region, radiating pain down either arm/jaw, chest pressure or pain with ambulation.  Does have SOB, chronic, at baseline from COPD.   She does smoke about 1 ppd for the last 40 yrs, but denies any alcohol or IV drug use.   Past Medical History  Diagnosis Date  . Coronary artery disease   . Pyelonephritis   . Pyelonephritis 1   Past Surgical History  Procedure Laterality Date  . Abdominal hysterectomy    . Coronary artery bypass graft  1   History reviewed. No pertinent family history. History  Substance Use Topics  . Smoking status: Current Every Day Smoker    Types: Cigarettes  . Smokeless tobacco: Not on file  . Alcohol Use: No   OB History   Grav Para Term Preterm Abortions TAB SAB Ect Mult Living                  Review of Systems  Constitutional: Positive for chills and appetite change. Negative for fever and activity change.  HENT: Negative for sore throat and trouble swallowing.   Eyes: Negative for photophobia and pain.  Respiratory: Negative for chest tightness and shortness of breath.   Cardiovascular: Negative for chest pain.  Gastrointestinal: Positive for nausea, vomiting and abdominal pain. Negative for blood in stool.  Endocrine: Negative.   Genitourinary: Negative.   Musculoskeletal: Negative.   Skin: Negative.   Neurological: Positive for weakness and headaches. Negative for dizziness, syncope, light-headedness and numbness.  Psychiatric/Behavioral: Positive for agitation. Negative for suicidal ideas, hallucinations, behavioral problems, confusion, sleep disturbance, self-injury and dysphoric mood. The patient is nervous/anxious. The patient is not hyperactive.        Denies SI/HI, no weapons in the house   All other systems reviewed and are negative.    Allergies  Advil and Codeine  Home Medications   Current Outpatient Rx  Name  Route  Sig  Dispense  Refill  . ALPRAZolam (XANAX) 1 MG tablet   Oral   Take 1 mg by mouth 3 (three) times daily as needed for anxiety.         Marland Kitchen amitriptyline (ELAVIL) 100 MG tablet   Oral  Take 100 mg by mouth at bedtime.         Marland Kitchen esomeprazole (NEXIUM) 40 MG capsule   Oral   Take 40 mg by mouth daily before breakfast.         . HYDROcodone-acetaminophen (NORCO) 10-325 MG per tablet   Oral   Take 1 tablet by mouth daily as needed for pain.         Marland Kitchen levofloxacin (LEVAQUIN) 750 MG tablet   Oral   Take 1 tablet (750 mg total) by mouth daily.   5 tablet   0    BP 116/62  Pulse 98  Temp(Src) 97.6 F (36.4 C) (Oral)  Resp 17  SpO2 95% Physical Exam  Nursing note and vitals reviewed. Constitutional: She is oriented to person, place, and time. She appears well-developed and well-nourished.  Non-toxic  appearance. She does not have a sickly appearance. She does not appear ill. She appears distressed.  HENT:  Head: Normocephalic and atraumatic.  Mouth/Throat: Mucous membranes are not dry.  Cardiovascular: Normal rate, regular rhythm, normal heart sounds and intact distal pulses.   No murmur heard. Pulmonary/Chest: Effort normal and breath sounds normal.  Abdominal: Normal appearance and bowel sounds are normal. There is generalized tenderness.  Musculoskeletal: Normal range of motion.  Neurological: She is alert and oriented to person, place, and time. She has normal strength and normal reflexes. No cranial nerve deficit or sensory deficit.  Skin: Skin is warm, dry and intact.  Psychiatric: Her speech is normal. Judgment normal. Her mood appears anxious. Her affect is not angry, not blunt, not labile and not inappropriate. She is agitated. She is not aggressive, not hyperactive, not slowed, not withdrawn, not actively hallucinating and not combative. Thought content is not paranoid and not delusional. Cognition and memory are normal. She does not exhibit a depressed mood. She expresses no homicidal and no suicidal ideation. She expresses no suicidal plans and no homicidal plans. She is attentive.    ED Course  Procedures (including critical care time)  Date: 11/07/2012  Rate: 98  Rhythm: normal sinus rhythm  QRS Axis: normal  Intervals: normal  ST/T Wave abnormalities: ST depression, most pronounced in V3/V4  Conduction Disutrbances: none  Narrative Interpretation:  ST depression more pronounced , inferior leads, anterior leads V3/V4  Old EKG Reviewed: More pronounced ST depression V3/V4  Labs Review Labs Reviewed  CBC - Abnormal; Notable for the following:    WBC 19.9 (*)    RBC 5.24 (*)    Hemoglobin 15.9 (*)    HCT 46.5 (*)    All other components within normal limits  COMPREHENSIVE METABOLIC PANEL - Abnormal; Notable for the following:    Glucose, Bld 114 (*)    Alkaline  Phosphatase 130 (*)    GFR calc non Af Amer 69 (*)    GFR calc Af Amer 80 (*)    All other components within normal limits  SALICYLATE LEVEL - Abnormal; Notable for the following:    Salicylate Lvl <2.0 (*)    All other components within normal limits  ETHANOL  ACETAMINOPHEN LEVEL  URINE RAPID DRUG SCREEN (HOSP PERFORMED)  POCT I-STAT TROPONIN I   Imaging Review Dg Chest 2 View  11/07/2012   CLINICAL DATA:  Short of breath and chest pain  EXAM: CHEST  2 VIEW  COMPARISON:  10/16/2012  FINDINGS: Bibasilar airspace disease shows mild progression. This could represent pneumonia. Continued followup until clearing suggested.  Prior median sternotomy. Negative for heart failure or  effusion.  IMPRESSION: Progression of bibasilar airspace disease, possibly pneumonia. Followup chest x-ray suggested.   Electronically Signed   By: Marlan Palau M.D.   On: 11/07/2012 15:07    MDM   1. Substance withdrawal   2. Nausea and vomiting   3. Community acquired pneumonia   Unsure if pt is actively going through withdrawal of multiple substances including Xanax and hydrocodone.  Will get UDS prior to starting on ativan drip as she has been on chronic xanax for over 30 yrs and maybe acutely withdrawaling from this.  As well, will obtain CMP, CBC, Acetaminophen, ASA, EtOH levels, EKG, and continue on monitors.  Currently, she is hemodynamically stable, but denies any hallucinations, SI/HI ideations, delusional thoughts.  She has capacity as AAO x 4 and does want to stop taking her long term medications but she does not want to go to rehabilitation for this.   2:41 PM - Pt has been emotionally stable w/o agitation since beginning the ativan drip.  She was slightly drowsy with the 2mg /hr titration so we backed back down to 0.5 mg.  Upon reassessment, settled on 1.0 mg/hr for now.  Her POC troponin was 0.03 and her EKG showing some nonspecific ST changes in V3/V4.  Discussed the case with the oncall cardiologist, Dr.  Elease Hashimoto from [facility] cardiology, who recommended if no chest pain and enzymes negative, this is most likely related to her substance use acutely and she will need outpt f/u.     3:15 PM - CXR showing possible PNA, however, pt afebrile, no increased saturation, normal BP, no uremia, no tachypnea and less than 28 y/o as she does not require inpt admission.  She does have co-morbidities for resistance for outpt CAP tx so will tx with Levaquin 750 mg qd x 5 days.  Pt does not have evidence of significant prolonged QTc (>512ms) and discussed risk and benefits of the medication with the pt, at which she agrees to use the medication.  Pt will be picked up by her son's girlfriend, as she recently had the ativan drip d/c.    Twana First Paulina Fusi, DO of [facility] 11/07/2012, 3:26 PM      Briscoe Deutscher, DO 11/07/12 1526

## 2012-11-07 NOTE — ED Notes (Signed)
Patient transported to X-ray 

## 2012-11-07 NOTE — ED Notes (Signed)
Bed: GL87 Expected date:  Expected time:  Means of arrival:  Comments: EMS-N/V/D-abdominal pain

## 2012-12-11 ENCOUNTER — Emergency Department (HOSPITAL_COMMUNITY)
Admission: EM | Admit: 2012-12-11 | Discharge: 2012-12-11 | Disposition: A | Discharge status: Home / Self Care | Payer: Medicare HMO | Attending: Emergency Medicine | Admitting: Emergency Medicine

## 2012-12-11 ENCOUNTER — Encounter (HOSPITAL_COMMUNITY): Payer: Self-pay | Admitting: Emergency Medicine

## 2012-12-11 ENCOUNTER — Emergency Department (HOSPITAL_COMMUNITY): Payer: Medicare HMO

## 2012-12-11 DIAGNOSIS — Z8742 Personal history of other diseases of the female genital tract: Secondary | ICD-10-CM | POA: Insufficient documentation

## 2012-12-11 DIAGNOSIS — J449 Chronic obstructive pulmonary disease, unspecified: Secondary | ICD-10-CM

## 2012-12-11 DIAGNOSIS — Z8701 Personal history of pneumonia (recurrent): Secondary | ICD-10-CM | POA: Insufficient documentation

## 2012-12-11 DIAGNOSIS — F17200 Nicotine dependence, unspecified, uncomplicated: Secondary | ICD-10-CM | POA: Insufficient documentation

## 2012-12-11 DIAGNOSIS — Z951 Presence of aortocoronary bypass graft: Secondary | ICD-10-CM | POA: Insufficient documentation

## 2012-12-11 DIAGNOSIS — J441 Chronic obstructive pulmonary disease with (acute) exacerbation: Secondary | ICD-10-CM | POA: Insufficient documentation

## 2012-12-11 DIAGNOSIS — I2510 Atherosclerotic heart disease of native coronary artery without angina pectoris: Secondary | ICD-10-CM | POA: Insufficient documentation

## 2012-12-11 DIAGNOSIS — Z79899 Other long term (current) drug therapy: Secondary | ICD-10-CM | POA: Insufficient documentation

## 2012-12-11 LAB — CBC WITH DIFFERENTIAL/PLATELET
Basophils Absolute: 0 10*3/uL (ref 0.0–0.1)
Basophils Relative: 0 % (ref 0–1)
Eosinophils Absolute: 0.2 10*3/uL (ref 0.0–0.7)
Lymphocytes Relative: 34 % (ref 12–46)
Lymphs Abs: 2.3 10*3/uL (ref 0.7–4.0)
MCH: 28.8 pg (ref 26.0–34.0)
MCHC: 32.8 g/dL (ref 30.0–36.0)
MCV: 87.8 fL (ref 78.0–100.0)
Neutro Abs: 3.7 10*3/uL (ref 1.7–7.7)
Neutrophils Relative %: 55 % (ref 43–77)
Platelets: 205 10*3/uL (ref 150–400)
RBC: 3.93 MIL/uL (ref 3.87–5.11)

## 2012-12-11 LAB — BASIC METABOLIC PANEL
Chloride: 105 mEq/L (ref 96–112)
GFR calc Af Amer: 78 mL/min — ABNORMAL LOW (ref >90)
GFR calc non Af Amer: 68 mL/min — ABNORMAL LOW (ref >90)
Glucose, Bld: 84 mg/dL (ref 70–99)
Potassium: 4 mEq/L (ref 3.5–5.1)
Sodium: 138 mEq/L (ref 135–145)

## 2012-12-11 LAB — TROPONIN I: Troponin I: <0.3 ng/mL (ref <0.30)

## 2012-12-11 MED ORDER — METHYLPREDNISOLONE SODIUM SUCC 125 MG IJ SOLR
125.0000 mg | Freq: Once | INTRAMUSCULAR | Status: AC
  Administered 2012-12-11: 125 mg via INTRAVENOUS
  Filled 2012-12-11: qty 2

## 2012-12-11 MED ORDER — PREDNISONE 10 MG PO TABS: 20.0000 mg | ORAL_TABLET | Freq: Every day | ORAL | Status: DC

## 2012-12-11 MED ORDER — AZITHROMYCIN 250 MG PO TABS: ORAL_TABLET | ORAL | Status: DC

## 2012-12-11 MED ORDER — OXYCODONE-ACETAMINOPHEN 5-325 MG PO TABS
2.0000 | ORAL_TABLET | Freq: Once | ORAL | Status: AC
  Administered 2012-12-11: 2 via ORAL
  Filled 2012-12-11: qty 2

## 2012-12-11 MED ORDER — IPRATROPIUM BROMIDE 0.02 % IN SOLN
0.5000 mg | Freq: Once | RESPIRATORY_TRACT | Status: AC
  Administered 2012-12-11: 0.5 mg via RESPIRATORY_TRACT
  Filled 2012-12-11: qty 2.5

## 2012-12-11 MED ORDER — ALBUTEROL SULFATE HFA 108 (90 BASE) MCG/ACT IN AERS
2.0000 | INHALATION_SPRAY | RESPIRATORY_TRACT | Status: DC
  Administered 2012-12-11: 2 via RESPIRATORY_TRACT
  Filled 2012-12-11: qty 6.7

## 2012-12-11 MED ORDER — SODIUM CHLORIDE 0.9 % IV SOLN
INTRAVENOUS | Status: DC
  Administered 2012-12-11: 14:00:00 via INTRAVENOUS

## 2012-12-11 MED ORDER — ALBUTEROL (5 MG/ML) CONTINUOUS INHALATION SOLN
10.0000 mg/h | INHALATION_SOLUTION | Freq: Once | RESPIRATORY_TRACT | Status: AC
  Administered 2012-12-11: 10 mg/h via RESPIRATORY_TRACT
  Filled 2012-12-11: qty 20

## 2012-12-11 NOTE — ED Notes (Signed)
MD at bedside. 

## 2012-12-11 NOTE — ED Notes (Signed)
Pt states was diagnosed w/ pna couple weeks ago, given antibiotic, but has not helped, states she's having shortness of breath, congested cough, chest pain.

## 2012-12-11 NOTE — ED Provider Notes (Signed)
CSN: [id number]     Arrival date & time 12/11/12  1222 History   First MD Initiated Contact with Patient 12/11/12 1237     Chief Complaint  Patient presents with  . Shortness of Breath  . Cough  . Chest Pain   (Consider location/radiation/quality/duration/timing/severity/associated sxs/prior Treatment) Patient is a 57 y.o. female presenting with shortness of breath, cough, and chest pain. The history is provided by the patient.  Shortness of Breath Associated symptoms: chest pain and cough   Cough Associated symptoms: chest pain and shortness of breath   Chest Pain Associated symptoms: cough and shortness of breath    patient here complaining of two-week history of cough and shortness of breath. Diagnosed with pneumonia 2 weeks ago and completed her course of treatment. Continues to note wheezing at home. Also notes chest pain worse with cough as well that resloves spontaneously sometimes but can last for seconds not associated with diaphoresis. She also continues to use tobacco products. Denies any leg pain or swelling. No syncope or near-syncope. No treatment used prior to arrival  Past Medical History  Diagnosis Date  . Coronary artery disease   . Pyelonephritis   . Pyelonephritis 1   Past Surgical History  Procedure Laterality Date  . Abdominal hysterectomy    . Coronary artery bypass graft  1   No family history on file. History  Substance Use Topics  . Smoking status: Current Every Day Smoker    Types: Cigarettes  . Smokeless tobacco: Not on file  . Alcohol Use: No   OB History   Grav Para Term Preterm Abortions TAB SAB Ect Mult Living                 Review of Systems  Respiratory: Positive for cough and shortness of breath.   Cardiovascular: Positive for chest pain.  All other systems reviewed and are negative.    Allergies  Codeine; Morphine and related; and Aleve  Home Medications   Current Outpatient Rx  Name  Route  Sig  Dispense  Refill  .  ALPRAZolam (XANAX) 1 MG tablet   Oral   Take 1 mg by mouth 3 (three) times daily.         Marland Kitchen esomeprazole (NEXIUM) 40 MG capsule   Oral   Take 40 mg by mouth daily before breakfast.         . guaiFENesin (MUCINEX) 600 MG 12 hr tablet   Oral   Take 600 mg by mouth 2 (two) times daily.          BP 115/56  Pulse 72  Temp(Src) 98.1 F (36.7 C) (Oral)  Resp 20  SpO2 97% Physical Exam  Nursing note and vitals reviewed. Constitutional: She is oriented to person, place, and time. She appears well-developed and well-nourished.  Non-toxic appearance. No distress.  HENT:  Head: Normocephalic and atraumatic.  Eyes: Conjunctivae, EOM and lids are normal. Pupils are equal, round, and reactive to light.  Neck: Normal range of motion. Neck supple. No tracheal deviation present. No mass present.  Cardiovascular: Normal rate, regular rhythm and normal heart sounds.  Exam reveals no gallop.   No murmur heard. Pulmonary/Chest: Effort normal. No stridor. No respiratory distress. She has decreased breath sounds. She has wheezes. She has no rhonchi. She has no rales.  Abdominal: Soft. Normal appearance and bowel sounds are normal. She exhibits no distension. There is no tenderness. There is no rebound and no CVA tenderness.  Musculoskeletal: Normal range of motion.  She exhibits no edema and no tenderness.  Neurological: She is alert and oriented to person, place, and time. She has normal strength. No cranial nerve deficit or sensory deficit. GCS eye subscore is 4. GCS verbal subscore is 5. GCS motor subscore is 6.  Skin: Skin is warm and dry. No abrasion and no rash noted.  Psychiatric: She has a normal mood and affect. Her speech is normal and behavior is normal.    ED Course  Procedures (including critical care time) Labs Review Labs Reviewed  TROPONIN I  CBC WITH DIFFERENTIAL  BASIC METABOLIC PANEL   Imaging Review No results found.  EKG Interpretation     Ventricular Rate:  70 PR  Interval:  94 QRS Duration: 82 QT Interval:  387 QTC Calculation: 418 R Axis:   83 Text Interpretation:  Sinus rhythm Short PR interval Minimal ST depression, diffuse leads Baseline wander in lead(s) V4 V6            MDM  No diagnosis found.   Patient given albuterol and Solu-Medrol here and wheezes have much improved. No concern for ACS or PE. She'll be discharged home on albuterol and prednisone with Zithromax. She was informed to quit smoking  Toy Baker, MD 12/11/12 [phone]

## 2013-03-22 ENCOUNTER — Emergency Department (HOSPITAL_COMMUNITY): Payer: Medicare HMO

## 2013-03-22 ENCOUNTER — Encounter (HOSPITAL_COMMUNITY): Payer: Self-pay | Admitting: Emergency Medicine

## 2013-03-22 ENCOUNTER — Inpatient Hospital Stay (HOSPITAL_COMMUNITY)
Admission: EM | Admit: 2013-03-22 | Discharge: 2013-03-25 | DRG: 377 | Disposition: A | Discharge status: Home / Self Care | Payer: Medicare HMO | Attending: Family Medicine | Admitting: Family Medicine

## 2013-03-22 DIAGNOSIS — Z951 Presence of aortocoronary bypass graft: Secondary | ICD-10-CM

## 2013-03-22 DIAGNOSIS — Z79899 Other long term (current) drug therapy: Secondary | ICD-10-CM

## 2013-03-22 DIAGNOSIS — G894 Chronic pain syndrome: Secondary | ICD-10-CM | POA: Diagnosis present

## 2013-03-22 DIAGNOSIS — Z7982 Long term (current) use of aspirin: Secondary | ICD-10-CM

## 2013-03-22 DIAGNOSIS — D649 Anemia, unspecified: Secondary | ICD-10-CM | POA: Diagnosis present

## 2013-03-22 DIAGNOSIS — J449 Chronic obstructive pulmonary disease, unspecified: Secondary | ICD-10-CM | POA: Diagnosis present

## 2013-03-22 DIAGNOSIS — E785 Hyperlipidemia, unspecified: Secondary | ICD-10-CM | POA: Diagnosis present

## 2013-03-22 DIAGNOSIS — R4182 Altered mental status, unspecified: Secondary | ICD-10-CM | POA: Diagnosis present

## 2013-03-22 DIAGNOSIS — W1800XA Striking against unspecified object with subsequent fall, initial encounter: Secondary | ICD-10-CM

## 2013-03-22 DIAGNOSIS — F17200 Nicotine dependence, unspecified, uncomplicated: Secondary | ICD-10-CM | POA: Diagnosis present

## 2013-03-22 DIAGNOSIS — J4489 Other specified chronic obstructive pulmonary disease: Secondary | ICD-10-CM | POA: Diagnosis present

## 2013-03-22 DIAGNOSIS — Z8673 Personal history of transient ischemic attack (TIA), and cerebral infarction without residual deficits: Secondary | ICD-10-CM

## 2013-03-22 DIAGNOSIS — Z889 Allergy status to unspecified drugs, medicaments and biological substances status: Secondary | ICD-10-CM

## 2013-03-22 DIAGNOSIS — Z8249 Family history of ischemic heart disease and other diseases of the circulatory system: Secondary | ICD-10-CM

## 2013-03-22 DIAGNOSIS — I2510 Atherosclerotic heart disease of native coronary artery without angina pectoris: Secondary | ICD-10-CM | POA: Diagnosis present

## 2013-03-22 DIAGNOSIS — Z885 Allergy status to narcotic agent status: Secondary | ICD-10-CM

## 2013-03-22 DIAGNOSIS — D509 Iron deficiency anemia, unspecified: Secondary | ICD-10-CM | POA: Diagnosis present

## 2013-03-22 DIAGNOSIS — K219 Gastro-esophageal reflux disease without esophagitis: Secondary | ICD-10-CM | POA: Diagnosis present

## 2013-03-22 DIAGNOSIS — F411 Generalized anxiety disorder: Secondary | ICD-10-CM | POA: Diagnosis present

## 2013-03-22 DIAGNOSIS — K922 Gastrointestinal hemorrhage, unspecified: Secondary | ICD-10-CM | POA: Diagnosis present

## 2013-03-22 DIAGNOSIS — F1210 Cannabis abuse, uncomplicated: Secondary | ICD-10-CM | POA: Diagnosis present

## 2013-03-22 DIAGNOSIS — J189 Pneumonia, unspecified organism: Secondary | ICD-10-CM

## 2013-03-22 DIAGNOSIS — G4700 Insomnia, unspecified: Secondary | ICD-10-CM | POA: Diagnosis present

## 2013-03-22 HISTORY — DX: Chronic obstructive pulmonary disease, unspecified: J44.9

## 2013-03-22 LAB — CBC
HEMATOCRIT: 26.5 % — AB (ref 36.0–46.0)
Hemoglobin: 8.6 g/dL — ABNORMAL LOW (ref 12.0–15.0)
MCH: 26.9 pg (ref 26.0–34.0)
MCHC: 32.5 g/dL (ref 30.0–36.0)
MCV: 82.8 fL (ref 78.0–100.0)
PLATELETS: 185 10*3/uL (ref 150–400)
RBC: 3.2 MIL/uL — AB (ref 3.87–5.11)
RDW: 15.9 % — ABNORMAL HIGH (ref 11.5–15.5)
WBC: 6.1 10*3/uL (ref 4.0–10.5)

## 2013-03-22 LAB — CBC WITH DIFFERENTIAL/PLATELET
Basophils Absolute: 0 10*3/uL (ref 0.0–0.1)
Basophils Relative: 0 % (ref 0–1)
Eosinophils Absolute: 0.1 10*3/uL (ref 0.0–0.7)
Eosinophils Relative: 2 % (ref 0–5)
HCT: 26.9 % — ABNORMAL LOW (ref 36.0–46.0)
Hemoglobin: 8.8 g/dL — ABNORMAL LOW (ref 12.0–15.0)
LYMPHS ABS: 1.1 10*3/uL (ref 0.7–4.0)
Lymphocytes Relative: 15 % (ref 12–46)
MCH: 27 pg (ref 26.0–34.0)
MCHC: 32.7 g/dL (ref 30.0–36.0)
MCV: 82.5 fL (ref 78.0–100.0)
MONO ABS: 0.4 10*3/uL (ref 0.1–1.0)
Monocytes Relative: 5 % (ref 3–12)
NEUTROS ABS: 5.9 10*3/uL (ref 1.7–7.7)
NEUTROS PCT: 78 % — AB (ref 43–77)
Platelets: 186 10*3/uL (ref 150–400)
RBC: 3.26 MIL/uL — AB (ref 3.87–5.11)
RDW: 15.9 % — ABNORMAL HIGH (ref 11.5–15.5)
WBC: 7.5 10*3/uL (ref 4.0–10.5)

## 2013-03-22 LAB — RAPID URINE DRUG SCREEN, HOSP PERFORMED
Amphetamines: NOT DETECTED
BARBITURATES: NOT DETECTED
BENZODIAZEPINES: NOT DETECTED
COCAINE: NOT DETECTED
Opiates: NOT DETECTED
Tetrahydrocannabinol: POSITIVE — AB

## 2013-03-22 LAB — TROPONIN I: Troponin I: <0.3 ng/mL (ref <0.30)

## 2013-03-22 LAB — COMPREHENSIVE METABOLIC PANEL
ALT: 26 U/L (ref 0–35)
AST: 31 U/L (ref 0–37)
Albumin: 3.5 g/dL (ref 3.5–5.2)
Alkaline Phosphatase: 93 U/L (ref 39–117)
BUN: 14 mg/dL (ref 6–23)
CALCIUM: 8.9 mg/dL (ref 8.4–10.5)
CHLORIDE: 104 meq/L (ref 96–112)
CO2: 24 meq/L (ref 19–32)
CREATININE: 0.98 mg/dL (ref 0.50–1.10)
GFR, EST AFRICAN AMERICAN: 72 mL/min — AB (ref >90)
GFR, EST NON AFRICAN AMERICAN: 62 mL/min — AB (ref >90)
GLUCOSE: 96 mg/dL (ref 70–99)
Potassium: 4.5 mEq/L (ref 3.7–5.3)
Sodium: 141 mEq/L (ref 137–147)
Total Bilirubin: <0.2 mg/dL — ABNORMAL LOW (ref 0.3–1.2)
Total Protein: 6.7 g/dL (ref 6.0–8.3)

## 2013-03-22 LAB — OCCULT BLOOD, POC DEVICE: Fecal Occult Bld: POSITIVE — AB

## 2013-03-22 LAB — TYPE AND SCREEN
ABO/RH(D): A POS
ANTIBODY SCREEN: POSITIVE
DAT, IgG: NEGATIVE
PT AG Type: NEGATIVE

## 2013-03-22 LAB — INFLUENZA PANEL BY PCR (TYPE A & B)
H1N1FLUPCR: NOT DETECTED
Influenza A By PCR: NEGATIVE
Influenza B By PCR: NEGATIVE

## 2013-03-22 LAB — ETHANOL

## 2013-03-22 LAB — APTT: APTT: 30 s (ref 24–37)

## 2013-03-22 LAB — PROTIME-INR
INR: 0.95 (ref 0.00–1.49)
PROTHROMBIN TIME: 12.5 s (ref 11.6–15.2)

## 2013-03-22 LAB — GLUCOSE, CAPILLARY: Glucose-Capillary: 101 mg/dL — ABNORMAL HIGH (ref 70–99)

## 2013-03-22 MED ORDER — ASPIRIN EC 81 MG PO TBEC
81.0000 mg | DELAYED_RELEASE_TABLET | Freq: Every day | ORAL | Status: DC
  Administered 2013-03-22 – 2013-03-23 (×2): 81 mg via ORAL
  Filled 2013-03-22 (×2): qty 1

## 2013-03-22 MED ORDER — THIAMINE HCL 100 MG/ML IJ SOLN
100.0000 mg | Freq: Every day | INTRAMUSCULAR | Status: DC
Start: 2013-03-22 — End: 2013-03-25
  Administered 2013-03-22 – 2013-03-24 (×3): 100 mg via INTRAVENOUS
  Filled 2013-03-22 (×2): qty 1
  Filled 2013-03-22: qty 2
  Filled 2013-03-22: qty 1

## 2013-03-22 MED ORDER — AZITHROMYCIN 500 MG PO TABS
500.0000 mg | ORAL_TABLET | Freq: Every day | ORAL | Status: AC
  Administered 2013-03-22: 500 mg via ORAL
  Filled 2013-03-22: qty 1

## 2013-03-22 MED ORDER — HYDROCODONE-ACETAMINOPHEN 5-325 MG PO TABS
1.0000 | ORAL_TABLET | ORAL | Status: DC | PRN
  Administered 2013-03-22: 2 via ORAL
  Administered 2013-03-23 (×3): 1 via ORAL
  Administered 2013-03-24: 2 via ORAL
  Administered 2013-03-25: 1 via ORAL
  Filled 2013-03-22: qty 1
  Filled 2013-03-22 (×2): qty 2
  Filled 2013-03-22: qty 1
  Filled 2013-03-22 (×2): qty 2
  Filled 2013-03-22: qty 1

## 2013-03-22 MED ORDER — ATORVASTATIN CALCIUM 40 MG PO TABS
40.0000 mg | ORAL_TABLET | Freq: Every day | ORAL | Status: DC
  Administered 2013-03-22 – 2013-03-24 (×3): 40 mg via ORAL
  Filled 2013-03-22 (×4): qty 1

## 2013-03-22 MED ORDER — SODIUM CHLORIDE 0.9 % IJ SOLN
3.0000 mL | Freq: Two times a day (BID) | INTRAMUSCULAR | Status: DC
  Administered 2013-03-23 – 2013-03-24 (×4): 3 mL via INTRAVENOUS

## 2013-03-22 MED ORDER — DEXTROSE 5 % IV SOLN
1.0000 g | INTRAVENOUS | Status: DC
  Administered 2013-03-22 – 2013-03-24 (×3): 1 g via INTRAVENOUS
  Filled 2013-03-22 (×4): qty 10

## 2013-03-22 MED ORDER — PANTOPRAZOLE SODIUM 40 MG IV SOLR
40.0000 mg | Freq: Two times a day (BID) | INTRAVENOUS | Status: DC
  Administered 2013-03-22 – 2013-03-24 (×5): 40 mg via INTRAVENOUS
  Filled 2013-03-22 (×7): qty 40

## 2013-03-22 MED ORDER — NICOTINE 14 MG/24HR TD PT24
14.0000 mg | MEDICATED_PATCH | Freq: Every day | TRANSDERMAL | Status: DC
  Administered 2013-03-22 – 2013-03-24 (×3): 14 mg via TRANSDERMAL
  Filled 2013-03-22 (×4): qty 1

## 2013-03-22 MED ORDER — FENTANYL CITRATE 0.05 MG/ML IJ SOLN
50.0000 ug | Freq: Once | INTRAMUSCULAR | Status: AC
  Administered 2013-03-22: 50 ug via INTRAVENOUS
  Filled 2013-03-22: qty 2

## 2013-03-22 MED ORDER — ONDANSETRON HCL 4 MG PO TABS: 4.0000 mg | ORAL_TABLET | Freq: Four times a day (QID) | ORAL | Status: DC | PRN

## 2013-03-22 MED ORDER — ZOLPIDEM TARTRATE 5 MG PO TABS
5.0000 mg | ORAL_TABLET | Freq: Every evening | ORAL | Status: DC | PRN
  Administered 2013-03-23: 5 mg via ORAL
  Filled 2013-03-22: qty 1

## 2013-03-22 MED ORDER — SODIUM CHLORIDE 0.9 % IV SOLN
1000.0000 mL | INTRAVENOUS | Status: AC
  Administered 2013-03-22 – 2013-03-23 (×2): 1000 mL via INTRAVENOUS

## 2013-03-22 MED ORDER — SODIUM CHLORIDE 0.9 % IV SOLN: INTRAVENOUS | Status: DC

## 2013-03-22 MED ORDER — AZITHROMYCIN 250 MG PO TABS
250.0000 mg | ORAL_TABLET | Freq: Every day | ORAL | Status: DC
  Filled 2013-03-22: qty 1

## 2013-03-22 MED ORDER — AMITRIPTYLINE HCL 100 MG PO TABS
100.0000 mg | ORAL_TABLET | Freq: Every day | ORAL | Status: DC
  Filled 2013-03-22: qty 1

## 2013-03-22 MED ORDER — ALBUTEROL SULFATE (2.5 MG/3ML) 0.083% IN NEBU: 2.5000 mg | INHALATION_SOLUTION | RESPIRATORY_TRACT | Status: DC | PRN

## 2013-03-22 MED ORDER — ONDANSETRON HCL 4 MG/2ML IJ SOLN
4.0000 mg | Freq: Four times a day (QID) | INTRAMUSCULAR | Status: DC | PRN
Start: 2013-03-22 — End: 2013-03-25

## 2013-03-22 MED ORDER — ALPRAZOLAM 0.5 MG PO TABS
1.0000 mg | ORAL_TABLET | Freq: Three times a day (TID) | ORAL | Status: DC | PRN
  Administered 2013-03-23 – 2013-03-25 (×4): 1 mg via ORAL
  Filled 2013-03-22 (×4): qty 2

## 2013-03-22 NOTE — ED Notes (Signed)
CBG 101 

## 2013-03-22 NOTE — ED Provider Notes (Signed)
CSN: [id number]     Arrival date & time 03/22/13  1155 History   First MD Initiated Contact with Patient 03/22/13 1157     Chief Complaint  Patient presents with  . Seizures    Patient is a 58 y.o. female presenting with seizures. The history is provided by the patient and the EMS personnel.  Seizures Seizure activity on arrival: no   Seizure type:  Grand mal (According to the EMS reports, the patient was in the kitchen this morning when she slowly fell to the ground and had a generalized seizure lasting for few minutes.) Preceding symptoms: no sensation of an aura present, no dizziness, no headache, no hyperventilation, no nausea and no numbness   Episode characteristics: generalized shaking   Return to baseline: yes   Severity:  Moderate Timing:  Once Context: not alcohol withdrawal, not change in medication, not drug use, not fever, not intracranial lesion and not intracranial shunt   Recent head injury:  No recent head injuries PTA treatment:  None History of seizures: yes (Patient has a history of having a prior seizure but is not taking any medications.)    patient currently denies any complaints other than a headache. Last she remembers is going to kitchen to make some breakfast. She denies any alcohol or drug use. She does not take any medications for seizures  Past Medical History  Diagnosis Date  . Coronary artery disease   . Pyelonephritis   . Pyelonephritis 1   Past Surgical History  Procedure Laterality Date  . Abdominal hysterectomy    . Coronary artery bypass graft  1   No family history on file. History  Substance Use Topics  . Smoking status: Current Every Day Smoker    Types: Cigarettes  . Smokeless tobacco: Not on file  . Alcohol Use: No   OB History   Grav Para Term Preterm Abortions TAB SAB Ect Mult Living                 Review of Systems  Neurological: Positive for seizures.  All other systems reviewed and are negative.    Allergies  Codeine;  Morphine and related; and Aleve  Home Medications   Current Outpatient Rx  Name  Route  Sig  Dispense  Refill  . albuterol (PROVENTIL HFA;VENTOLIN HFA) 108 (90 BASE) MCG/ACT inhaler   Inhalation   Inhale 2 puffs into the lungs every 6 (six) hours as needed for wheezing or shortness of breath.         Marland Kitchen amitriptyline (ELAVIL) 100 MG tablet   Oral   Take 100 mg by mouth at bedtime.         Marland Kitchen aspirin EC 81 MG tablet   Oral   Take 81 mg by mouth daily.         Marland Kitchen atorvastatin (LIPITOR) 40 MG tablet   Oral   Take 40 mg by mouth at bedtime.         Marland Kitchen esomeprazole (NEXIUM) 40 MG capsule   Oral   Take 40 mg by mouth at bedtime.          . ondansetron (ZOFRAN) 4 MG tablet   Oral   Take 4 mg by mouth every 8 (eight) hours as needed for nausea or vomiting.         Marland Kitchen ALPRAZolam (XANAX) 1 MG tablet   Oral   Take 1 mg by mouth 3 (three) times daily as needed for anxiety.  BP 123/58  Pulse 89  Temp(Src) 97.7 F (36.5 C) (Oral)  Resp 18  SpO2 98% Physical Exam  Nursing note and vitals reviewed. Constitutional: She is oriented to person, place, and time. She appears well-developed and well-nourished. No distress.  HENT:  Head: Normocephalic and atraumatic.  Right Ear: External ear normal.  Left Ear: External ear normal.  Mouth/Throat: Oropharynx is clear and moist.  Eyes: Conjunctivae are normal. Right eye exhibits no discharge. Left eye exhibits no discharge. No scleral icterus.  Neck: Neck supple. No tracheal deviation present.  Cardiovascular: Normal rate, regular rhythm and intact distal pulses.   Pulmonary/Chest: Effort normal and breath sounds normal. No stridor. No respiratory distress. She has no wheezes. She has no rales.  Abdominal: Soft. Bowel sounds are normal. She exhibits no distension. There is no tenderness. There is no rebound and no guarding.  Musculoskeletal: She exhibits no edema and no tenderness.  Neurological: She is alert and  oriented to person, place, and time. She has normal strength. No cranial nerve deficit (No facial droop, extraocular movements intact, tongue midline ) or sensory deficit. She exhibits normal muscle tone. She displays no seizure activity. Coordination normal.  No pronator drift bilateral upper extrem, able to hold both legs off bed for 5 seconds, sensation intact in all extremities, no visual field cuts, no left or right sided neglect, normal finger-nose exam bilaterally, no nystagmus noted   Skin: Skin is warm and dry. No rash noted.  Psychiatric: She has a normal mood and affect.    ED Course  Procedures (including critical care time) Labs Review Labs Reviewed  COMPREHENSIVE METABOLIC PANEL - Abnormal; Notable for the following:    Total Bilirubin <0.2 (*)    GFR calc non Af Amer 62 (*)    GFR calc Af Amer 72 (*)    All other components within normal limits  URINE RAPID DRUG SCREEN (HOSP PERFORMED) - Abnormal; Notable for the following:    Tetrahydrocannabinol POSITIVE (*)    All other components within normal limits  CBC WITH DIFFERENTIAL - Abnormal; Notable for the following:    RBC 3.26 (*)    Hemoglobin 8.8 (*)    HCT 26.9 (*)    RDW 15.9 (*)    Neutrophils Relative % 78 (*)    All other components within normal limits  GLUCOSE, CAPILLARY - Abnormal; Notable for the following:    Glucose-Capillary 101 (*)    All other components within normal limits  OCCULT BLOOD, POC DEVICE - Abnormal; Notable for the following:    Fecal Occult Bld POSITIVE (*)    All other components within normal limits  ETHANOL  APTT  PROTIME-INR  TYPE AND SCREEN   Imaging Review Ct Head Wo Contrast  03/22/2013   CLINICAL DATA:  Seizure  EXAM: CT HEAD WITHOUT CONTRAST  TECHNIQUE: Contiguous axial images were obtained from the base of the skull through the vertex without intravenous contrast. Study was obtained within 24 hr of patient's arrival at the emergency department.  COMPARISON:  Brain CT and  brain MRI October 16, 2012  FINDINGS: The ventricles are normal in size and configuration. There is no demonstrable mass, hemorrhage, extra-axial fluid collection, or midline shift. Asymmetric decreased attenuation in the posterior mid right cerebellum compared to the left is stable and may represent prior infarct. There is patchy small vessel disease in the centra semiovale bilaterally. There is no demonstrable acute infarct.  Bony calvarium appears intact.  The mastoid air cells are clear.  IMPRESSION: Patchy periventricular small vessel disease. Asymmetric decreased attenuation in the posterior mid right cerebellum compare of the left is stable and may represent prior infarct in this area. No acute appearing infarct is seen on this study. No hemorrhage or mass effect.   Electronically Signed   By: Bretta BangWilliam  Woodruff M.D.   On: 03/22/2013 12:57    EKG Interpretation    Date/Time:  Sunday March 22 2013 12:01:37 EST Ventricular Rate:  95 PR Interval:  174 QRS Duration: 105 QT Interval:  408 QTC Calculation: 513 R Axis:   81 Text Interpretation:  Sinus rhythm Borderline repolarization abnormality Prolonged QT interval No significant change since last tracing Confirmed by Charlea Nardo  MD-J, Jataya Wann ([id number]) on 03/22/2013 12:12:51 PM           Medications  thiamine (B-1) injection 100 mg (100 mg Intravenous Given 03/22/13 1420)  0.9 %  sodium chloride infusion (not administered)  fentaNYL (SUBLIMAZE) injection 50 mcg (50 mcg Intravenous Given 03/22/13 1445)    MDM   1. GI bleed    Patient presented with a possible seizure according to the family. It is possible that she may have had a syncopal episode as well however she did seem to have a post ictal phase. However, the patient has anemia is worse compared to prior labs on file. She does have a Hemoccult positive stool. Patient does admit to taking NSAIDs. We'll consult the medical service regarding admission for further evaluation     Celene KrasJon R Mckensie Scotti,  MD 03/22/13 [phone]

## 2013-03-22 NOTE — H&P (Signed)
Family Medicine Teaching [facility] Admission History and Physical Service Pager: [phone]  Patient name: Ann [name] Medical record number: [id number] Date of birth: 02/17/1955 Age: 58 y.o. Gender: female  Primary Care Provider: Pcp Not In System (Dr. Adriana Simas in [place], has since recently moved to [facility]) Consultants: none Code Status: Full (confirmed on admission)  Chief Complaint: Chest pain, productive cough, subjective fever, pre-syncopal episode with possible seizure activity  Assessment and Plan: Ann [name] is a 58 y.o. female presenting with pre-syncopal vs syncopal episode with possible seizure-like activity, complains of worsened productive cough x 1 week and chest pain . PMH is significant for COPD (2/2 tobacco abuse), GERD, anemia, CVA, anxiety, chronic pain syndrome, chest surgery for cardiac tumor removal. (HX of CAD with CABG documented in Hx section but she denies this)  # Symptomatic Anemia, secondary to suspected UGI bleed, in setting of h/o GERD- likely chronic/subacute Presents with decreased Hgb to 8.8 on admission with FOBT (positive), reports of melena without BRBPR or hematemesis, no overt clinical evidence of bleeding. Previous Hgb baseline ~ 12 (2014). Unclear if h/o PUD, however significant h/o frequent NSAID use, and previously treated with supplemental iron for anemia in past. - Admit to FPTS, telemetry - closely monitor VS for any acute changes that suggest volume loss or persistent bleeding - f/u Hgb trend 8.8-->8.6>>, slight decrease likely secondary to dilutional effect, no significant acute drop currently - check CBC q 8 hrs, consider GI consult for Upper EGD if persistently - Protonix IV 40mg  BID - if Hgb acutely worsening or < 7.0, will consult GI for acute eval / mangement - transfusion threshold for Hgb < 7.0, given pt is currently symptomatic  # Presumed LLL CAP Clinical history/exam supportive of CAP with worsening productive cough, subjective h/o  fevers/chills, also chest pain. CXR with evidence of L-basilar infiltrate (compared to prior 12/11/12). WBC not elevated at 7.5-->6.1 - currently afebrile, f/u fever curve - droplet precautions - influenza panel (pending) - start antibiotic coverage for CAP:     - CTX 1g daily     - Azithro PO (Z-pak dosing) x 5 days - consider repeat CXR 2view if worsening  # Chest pain, rule out MI Clinically CP appears atypical in description, however not reproducible on exam. Unclear history with prior documentation of CAD and s/p CABG, however patient denies having an MI. Note patient does have evidence of thoracotomy, and charted h/o chest surgery for heart tumor removal. Significant risk factors (+smoker, >50 yrs, HLD). - continue ASA 81mg , until MI ruled out, and then hold d/t current GI bleed. Believe that benefit in setting of potential acute chest pain outweighs inc bleeding risk.  - cycle Troponin-I q 8 hr (negative x 1, pending x2) - EKG - NSR, normal axis, prolonged QT with QTC 513, no acute ST-T wave findings - repeat EKG in AM - risk stratification labs: HgbA1c, TSH, lipid panel  # Seizure vs Syncope Unclear etiology given history of episode this AM. No reported LOC or prior aura-like symptoms, suspect more likely pre-syncopal episode. Concern for seizure-like activity related to episode with b/l ext shaking resolved < 1 min, increased likelihood for potential seizure with described post-ictal phase following episode. Differential includes cardiogenic vs neurologic etiology of episode, including CVA, dysrhythmia (concern with identified prolonged QT on EKG). Head CT negative for acute infarct vs bleed - seizure precautions - ordered Orthostatic VS, given report of dizziness on standing - ordered 2D ECHO, syncope work-up, rule out embolic etiology - If worsens  and suspect any acute neurological change vs new seizure-like activity, would consider consulting Neurology and ordering MRI head - EEG  in am - Evidence of QT prolongation with QTc 513 on EKG - Check magnesium - Hold Amitriptyline (inc risk QT prolong)  # H/o COPD Suspect that respiratory symptoms are more related to focal findings of CAP plus febrile, possible COPD exacerbation with inc cough / sputum production. - ordered Albuterol PRN - consider Duonebs and course of steroids if resp status declines  # Tobacco Abuse, chronic - H/o 1ppd x 40 years - Nicotine Patch  # HLD - Continue home atorvastatin 40mg  daily   # Chronic Pain Syndrome - Continue Norco 5-325mg  q 4 hr PRN  # Psych: Anxiety, Insomnia - Continue Xanax 1mg  TID PRN anxiety - Hold Amitriptyline given QT prolong risk - Ordered Ambien 5mg  nightly PRN  FEN/GI: NS 125cc /hr x 24 hrs, regular diet Prophylaxis: SCDs (no pharm VTE prophylaxis in setting of likely GI bleed)  Disposition: Admit to FPTS, inpatient status for close monitoring and further diagnostic work-up, pending clinical improvement, expect patient to be discharged to home in 1-3 days.  History of Present Illness: Ann [name] is a 58 y.o. female presenting with pre-syncopal episode with possible seizure-like activity, complains of worsened productive cough x 1 week and chest pain.  Reports incident occurred this morning where she fell to the ground, unable to recall what happened right before the event, states that her son told her that both her arms and legs were shaking, which lasted about 1 minute. She remained awake during and after the whole episode, without loss of consciousness. Denies history of prior seizures, but per chart review last admission to hospital (10/2012) evaluated for possible seizure with TIA work-up. No further shaking or seizure-like activity since episode. She is not aware of any stroke history.   Current chief complaint is increased productive (yellow sputum) cough x 1 week and chest pain of similar duration. Describes chest pain as "sharp" or "lightning" with some  radiation to back. Duration for hours at a time, but intermittently occurs. Denies pleuritic component, questionable if worse with ambulation, associated with +nausea. No relief from any medicines. Frequently takes ibuprofen and aleve, admits to ibuprofen 4x daily (most days).  Recent hospitalization 10/2012 for possible stroke.  Review Of Systems: Per HPI with the following additions:  Admits hot-flashes vs chills, black-tarry stool occasionally but denies bloody or bright red blood in stools. Nausea intermittently. Chest pain, HA for few days. Difficulty sleeping. +Dizziness (worse on standing up x several months).  Denies vomiting, photo/phonophobia  Otherwise 12 point review of systems was performed and was unremarkable.  Patient Active Problem List   Diagnosis Date Noted  . GI bleed 03/22/2013  . Fall against object 10/16/2012  . Slurred speech 10/16/2012  . History of benign cardiac tumor 10/16/2012  . COPD (chronic obstructive pulmonary disease) 10/16/2012   Past Medical History: Past Medical History  Diagnosis Date  . Coronary artery disease   . Pyelonephritis   . Pyelonephritis 1   Past Surgical History: Past Surgical History  Procedure Laterality Date  . Abdominal hysterectomy    . Coronary artery bypass graft  1   Social History: History  Substance Use Topics  . Smoking status: Current Every Day Smoker    Types: Cigarettes  . Smokeless tobacco: Not on file  . Alcohol Use: No   Additional social history: Recently moved to [place] from [place], has no PCP currently.  Smoke 1ppd  x 40 years  Please also refer to relevant sections of EMR.  Family History: No family history on file. Allergies and Medications: Allergies  Allergen Reactions  . Codeine Nausea And Vomiting  . Morphine And Related     Makes me me crazy  . Aleve [Naproxen Sodium] Rash   No current facility-administered medications on file prior to encounter.   Current Outpatient Prescriptions on  File Prior to Encounter  Medication Sig Dispense Refill  . esomeprazole (NEXIUM) 40 MG capsule Take 40 mg by mouth at bedtime.       . ALPRAZolam (XANAX) 1 MG tablet Take 1 mg by mouth 3 (three) times daily as needed for anxiety.         Objective: BP 99/61  Pulse 86  Temp(Src) 97.7 F (36.5 C) (Oral)  Resp 20  SpO2 96% Exam: General: sitting up in bed, pleasant and cooperative, NAD, sits up easily in bed HEENT: NCAT, PERRL, EOMI, oropharynx clear, dry appearing MM Cardiovascular: RRR, no murmurs heard Respiratory: Bibasilar crackles heard. No wheezing or rhonchi. Mild decreased air movement b/l bases, otherwise decent air movement. Normal WOB, no tachypnea. Abdomen: soft, NTND, +active BS Extremities: non-tender, no edema, moves all ext, +2 peripheral pulses intact Skin: warm, dry, no rashes Neuro: awake, alert, oriented, CN II-XII grossly intact, normal muscle strength 5/5 b/l ext, and intact distal sensation  Labs and Imaging: CBC BMET   Recent Labs Lab 03/22/13 1215  WBC 7.5  HGB 8.8*  HCT 26.9*  PLT 186    Recent Labs Lab 03/22/13 1215  NA 141  K 4.5  CL 104  CO2 24  BUN 14  CREATININE 0.98  GLUCOSE 96  CALCIUM 8.9     UTox - THC (positive), all other negative  FOBT - Positive  Troponin-I - negative x 1 (x2 pending)  2/8 EKG NSR, HR 95, normal axis, prolonged QT with QTC 513, no acute ST-T wave findings  2/8 Head CT w/o contrast IMPRESSION:  Patchy periventricular small vessel disease. Asymmetric decreased  attenuation in the posterior mid right cerebellum compare of the  left is stable and may represent prior infarct in this area. No  acute appearing infarct is seen on this study. No hemorrhage or mass  effect.  2/8 CXR Portable 1v IMPRESSION:  Left base infiltrate.  Saralyn Pilar, DO 03/22/2013, 4:30 PM PGY-1, [facility] Family Medicine FPTS Intern pager: [phone], text pages welcome  I have seen and examined the patient and agree  with Dr. Althea Charon' documentation above. My annotations are in blue.   Murtis Sink, MD [facility] Family Medicine Resident, PGY-2 03/22/2013, 9:28 PM

## 2013-03-22 NOTE — ED Notes (Signed)
Patient on 2L per patient requested.

## 2013-03-22 NOTE — ED Notes (Signed)
Per EMS, family states patient was in the kitchen and slowly went to the ground where she then had a "full body seizure." Family states patient has never had a seizure, but patient states last time she was here they told her she had had a seizure and the diagnosis was a TIA. Per EMS, patient was disoriented on arrival but now is Alert and Oriented x4. Per EMS, right leg weakness noted and no facial droop noted. Initial vital signs given by EMS were 110/60, 98 HR, 18 RR, 97% on 2L [place], CBG 107, and EKG showed NSR with ST depression note by EMS.

## 2013-03-22 NOTE — ED Notes (Signed)
Patient taken to CT.

## 2013-03-23 ENCOUNTER — Encounter (HOSPITAL_COMMUNITY): Payer: Self-pay | Admitting: Neurology

## 2013-03-23 ENCOUNTER — Inpatient Hospital Stay (HOSPITAL_COMMUNITY): Payer: Medicare HMO

## 2013-03-23 DIAGNOSIS — R569 Unspecified convulsions: Secondary | ICD-10-CM

## 2013-03-23 DIAGNOSIS — I059 Rheumatic mitral valve disease, unspecified: Secondary | ICD-10-CM

## 2013-03-23 DIAGNOSIS — D649 Anemia, unspecified: Secondary | ICD-10-CM

## 2013-03-23 DIAGNOSIS — R55 Syncope and collapse: Secondary | ICD-10-CM

## 2013-03-23 DIAGNOSIS — K922 Gastrointestinal hemorrhage, unspecified: Principal | ICD-10-CM

## 2013-03-23 LAB — CBC WITH DIFFERENTIAL/PLATELET
BASOS PCT: 1 % (ref 0–1)
Basophils Absolute: 0 10*3/uL (ref 0.0–0.1)
EOS ABS: 0.2 10*3/uL (ref 0.0–0.7)
Eosinophils Relative: 4 % (ref 0–5)
HCT: 25.9 % — ABNORMAL LOW (ref 36.0–46.0)
HEMOGLOBIN: 8.4 g/dL — AB (ref 12.0–15.0)
LYMPHS ABS: 2.1 10*3/uL (ref 0.7–4.0)
Lymphocytes Relative: 38 % (ref 12–46)
MCH: 27.1 pg (ref 26.0–34.0)
MCHC: 32.4 g/dL (ref 30.0–36.0)
MCV: 83.5 fL (ref 78.0–100.0)
MONO ABS: 0.4 10*3/uL (ref 0.1–1.0)
MONOS PCT: 7 % (ref 3–12)
NEUTROS ABS: 2.9 10*3/uL (ref 1.7–7.7)
NEUTROS PCT: 51 % (ref 43–77)
Platelets: 182 10*3/uL (ref 150–400)
RBC: 3.1 MIL/uL — AB (ref 3.87–5.11)
RDW: 16.2 % — ABNORMAL HIGH (ref 11.5–15.5)
WBC: 5.6 10*3/uL (ref 4.0–10.5)

## 2013-03-23 LAB — IRON AND TIBC
Iron: 27 ug/dL — ABNORMAL LOW (ref 42–135)
SATURATION RATIOS: 7 % — AB (ref 20–55)
TIBC: 382 ug/dL (ref 250–470)
UIBC: 355 ug/dL (ref 125–400)

## 2013-03-23 LAB — LIPID PANEL
Cholesterol: 170 mg/dL (ref 0–200)
HDL: 62 mg/dL (ref >39)
LDL Cholesterol: 83 mg/dL (ref 0–99)
Total CHOL/HDL Ratio: 2.7 RATIO
Triglycerides: 126 mg/dL (ref <150)
VLDL: 25 mg/dL (ref 0–40)

## 2013-03-23 LAB — CBC
HCT: 27.6 % — ABNORMAL LOW (ref 36.0–46.0)
HEMATOCRIT: 24.9 % — AB (ref 36.0–46.0)
HEMOGLOBIN: 7.9 g/dL — AB (ref 12.0–15.0)
HEMOGLOBIN: 8.7 g/dL — AB (ref 12.0–15.0)
MCH: 26.7 pg (ref 26.0–34.0)
MCH: 26.5 pg (ref 26.0–34.0)
MCHC: 31.7 g/dL (ref 30.0–36.0)
MCHC: 31.5 g/dL (ref 30.0–36.0)
MCV: 84.1 fL (ref 78.0–100.0)
MCV: 84.1 fL (ref 78.0–100.0)
PLATELETS: 195 10*3/uL (ref 150–400)
Platelets: 173 10*3/uL (ref 150–400)
RBC: 2.96 MIL/uL — ABNORMAL LOW (ref 3.87–5.11)
RBC: 3.28 MIL/uL — ABNORMAL LOW (ref 3.87–5.11)
RDW: 16.4 % — ABNORMAL HIGH (ref 11.5–15.5)
RDW: 16.4 % — ABNORMAL HIGH (ref 11.5–15.5)
WBC: 5.2 10*3/uL (ref 4.0–10.5)
WBC: 6.2 10*3/uL (ref 4.0–10.5)

## 2013-03-23 LAB — BASIC METABOLIC PANEL
BUN: 18 mg/dL (ref 6–23)
CO2: 23 meq/L (ref 19–32)
Calcium: 8.5 mg/dL (ref 8.4–10.5)
Chloride: 105 mEq/L (ref 96–112)
Creatinine, Ser: 1.27 mg/dL — ABNORMAL HIGH (ref 0.50–1.10)
GFR calc Af Amer: 52 mL/min — ABNORMAL LOW (ref >90)
GFR calc non Af Amer: 45 mL/min — ABNORMAL LOW (ref >90)
Glucose, Bld: 96 mg/dL (ref 70–99)
POTASSIUM: 3.9 meq/L (ref 3.7–5.3)
SODIUM: 140 meq/L (ref 137–147)

## 2013-03-23 LAB — FERRITIN: Ferritin: 10 ng/mL (ref 10–291)

## 2013-03-23 LAB — TROPONIN I
Troponin I: <0.3 ng/mL (ref <0.30)
Troponin I: <0.3 ng/mL (ref <0.30)

## 2013-03-23 LAB — HEMOGLOBIN A1C
Hgb A1c MFr Bld: 5.8 % — ABNORMAL HIGH (ref <5.7)
Mean Plasma Glucose: 120 mg/dL — ABNORMAL HIGH (ref <117)

## 2013-03-23 LAB — MAGNESIUM: MAGNESIUM: 2 mg/dL (ref 1.5–2.5)

## 2013-03-23 LAB — TSH: TSH: 2.341 u[IU]/mL (ref 0.350–4.500)

## 2013-03-23 MED ORDER — IPRATROPIUM-ALBUTEROL 0.5-2.5 (3) MG/3ML IN SOLN
3.0000 mL | RESPIRATORY_TRACT | Status: AC
  Administered 2013-03-23 (×2): 3 mL via RESPIRATORY_TRACT
  Filled 2013-03-23 (×3): qty 3

## 2013-03-23 MED ORDER — GADOBENATE DIMEGLUMINE 529 MG/ML IV SOLN
15.0000 mL | Freq: Once | INTRAVENOUS | Status: AC | PRN
  Administered 2013-03-23: 15 mL via INTRAVENOUS

## 2013-03-23 MED ORDER — GADOBENATE DIMEGLUMINE 529 MG/ML IV SOLN: 15.0000 mL | Freq: Once | INTRAVENOUS | Status: AC | PRN

## 2013-03-23 NOTE — Progress Notes (Signed)
Family Medicine Teaching Service Daily Progress Note Intern Pager: [phone]  Patient name: Ann [name] Medical record number: [id number] Date of birth: [birth year] Age: 58 y.o. Gender: female  Primary Care Provider: Pcp Not In System Consultants: Neurology Code Status: Full (confirmed on admission)  Pt Overview and Major Events to Date:  2/8 - admitted, Hgb 8.8-->8.6, FOBT (positive), started CTX / Azithro 2/9 - Hgb 8.4-->7.9, ordered iron studies, DC'd Azithro, c/s Neuro (ordered MRI Brain), c/s GI  Assessment and Plan: Ann [name] is a 58 y.o. female presenting with pre-syncopal vs syncopal episode with possible seizure-like activity, complains of worsened productive cough x 1 week and chest pain . PMH is significant for COPD (2/2 tobacco abuse), GERD, anemia, CVA, anxiety, chronic pain syndrome, chest surgery for cardiac tumor removal. (HX of CAD with CABG documented in Hx section but she denies this)  # Symptomatic Anemia, secondary to suspected UGI bleed, in setting of h/o GERD- likely chronic/subacute  Presents with decreased Hgb to 8.8 on admission with FOBT (positive), reports of melena without BRBPR or hematemesis, no overt clinical evidence of bleeding. Previous Hgb baseline ~ 12 (2014). Unclear if h/o PUD, however significant h/o frequent NSAID use, and previously treated with supplemental iron for anemia in past. Also, consider colon CA in differential, unclear if pt has had colonoscopy in past (age 42). - telemetry, closely monitor VS for any acute changes that suggest volume loss or persistent bleeding  - continued decreasing Hgb trend 8.8-->8.6-->8.4-->7.9, suspect initial dilutional effect, however persistent decline within 24 hrs without acute active bleeding - monitor CBC daily - ordered iron studies to eval normocytic anemia, more likely from acute loss but consider alternative options such as hemolysis vs PNA related anemia, consider order haptoglobin - Protonix IV 40mg   BID - if Hgb acutely worsening or Hgb <7.0 consider transfusion threshold, given pt is currently symptomatic - c/s GI Deboraha Sprang) re: eval of GI bleed - greatly appreciate all recommendations    - Discussed case with Dr. Matthias Hughs, plans to see patient today, expects patient will most likely require EGD and possibly colonoscopy during current admission to eval bleeding  # Presumed LLL CAP  Clinical history/exam supportive of CAP with worsening productive cough, subjective h/o fevers/chills, also chest pain. CXR with evidence of L-basilar infiltrate (compared to prior 12/11/12). WBC not elevated at 7.5-->6.1  - currently afebrile, f/u fever curve  - droplet precautions  - influenza panel (negative)  - continue antibiotic coverage for CAP on (2/8>>):       - continue CTX 1g daily      - (2/9) DC'd Azithro PO - given concern of prolonged QT syndrome - consider repeat CXR 2view if worsening  # Chest pain, rule out MI  Clinically CP appears atypical in description, however not reproducible on exam. Unclear history with prior documentation of CAD and s/p CABG, however patient denies having an MI. Note patient does have evidence of thoracotomy, and charted h/o chest surgery for heart tumor removal. Significant risk factors (+smoker, >50 yrs, HLD).  - Hold ASA, previously continued ASA 81mg  until MI ruled out, and then hold d/t current GI bleed. Believe that benefit in setting of potential acute chest pain outweighs inc bleeding risk. - cycle Troponin-I q 8 hr (negative x 3) - EKG - NSR, normal axis, prolonged QT with QTC 513, no acute ST-T wave findings  - repeat EKG in AM  - risk stratification labs: HgbA1c, TSH (2.341), lipid panel (TC 170, TG 126, HDL 62, LDL 83)  #  Seizure vs Syncope  Unclear etiology given history of episode this AM. No reported LOC or prior aura-like symptoms, suspect more likely pre-syncopal episode. Concern for seizure-like activity related to episode with b/l ext shaking resolved  < 1 min, increased likelihood for potential seizure with described post-ictal phase following episode.  Differential includes cardiogenic vs neurologic etiology of episode, including CVA, dysrhythmia (concern with identified prolonged QT on EKG). Head CT negative for acute infarct vs bleed - seizure precautions - Orthostatic VS - Negative (100/52 --> 91/52 --> 99/44, with stable pulse) - ordered 2D ECHO, syncope work-up, rule out embolic etiology  - Evidence of QT prolongation with QTc 513 on EKG - Hold amitriptyline - Mag 2.0 / K 3.9 - no inc risk for dssarrhythmia - ordered EEG in AM - c/s Neurology re: potential seizure vs pre-syncope/syncope - greatly appreciate all recommendations    - requested to order MRI Brain w/o contrast  # H/o COPD  Suspect that respiratory symptoms are more related to focal findings of CAP plus febrile, possible COPD exacerbation with inc cough / sputum production.  - ordered scheduled Duonebs q 4 hr x 3 doses, to determine if improving, and would consider potential mild COPD exacerbation - albuterol neb q 2 hr PRN - consider course of steroids  # Tobacco Abuse, chronic  - H/o 1ppd x 40 years  - Nicotine Patch  # HLD  - Continue home atorvastatin 40mg  daily  # Chronic Pain Syndrome  - Continue Norco 5-325mg  q 4 hr PRN  # Psych: Anxiety, Insomnia  - Continue Xanax 1mg  TID PRN anxiety  - Hold Amitriptyline given QT prolong risk  - Ordered Ambien 5mg  nightly PRN  FEN/GI: NS 125cc /hr x 24 hrs, regular diet  Prophylaxis: SCDs (no pharm VTE prophylaxis in setting of likely GI bleed)  Disposition: Admit to FPTS, inpatient status for close monitoring and further diagnostic work-up, pending clinical improvement, expect patient to be discharged to home in 1-3 days.  Subjective: No acute events overnight. This morning she reports feeling mostly unchanged from yesterday, still c/o shortness of breath with productive cough. Endorses same chest pain (sharp,  lightning, denies tightness / pressure / aching) with some radiation through to back, states pain has long duration (not quick and intermittent, and feels "more often than not" during the day). Admits HA (8/10) but better than yesterday, some improvement with meds. Admits dizzy when stands (chronically). Denies any new complaints of vision changes, numbness, tingling, weakness, pre-syncopal event, seizure-like activity. No BM today, last BM yesterday was dark.  Objective: Temp:  [97.5 F (36.4 C)-98 F (36.7 C)] 98 F (36.7 C) (02/09 0512) Pulse Rate:  [80-93] 88 (02/09 0800) Resp:  [16-20] 18 (02/09 0800) BP: (91-125)/(44-62) 95/50 mmHg (02/09 0512) SpO2:  [92 %-98 %] 96 % (02/09 0800) Weight:  [169 lb 4.8 oz (76.794 kg)-174 lb 6.4 oz (79.107 kg)] 174 lb 6.4 oz (79.107 kg) (02/09 0523) Physical Exam: General: pleasant and cooperative, laying in bed resting, NAD HEENT: NCAT, PERRL, EOMI, oropharynx clear, mild dry appearing MM  Cardiovascular: RRR, no murmurs heard  Respiratory: Bibasilar crackles heard (L>R), few scattered exp wheezes. Mild decreased air movement b/l bases, otherwise decent air movement. Normal WOB, no tachypnea.  Abdomen: soft, NTND, +active BS  Extremities: non-tender, no edema, moves all ext, +2 peripheral pulses intact  Skin: warm, dry, no rashes  Neuro: awake, alert, oriented, CN II-XII grossly intact, normal muscle strength 5/5 b/l ext, and intact distal sensation  Laboratory:  Recent Labs Lab 03/22/13 1811 03/23/13 0054 03/23/13 0935  WBC 6.1 5.6 5.2  HGB 8.6* 8.4* 7.9*  HCT 26.5* 25.9* 24.9*  PLT 185 182 173    Recent Labs Lab 03/22/13 1215 03/23/13 0054  NA 141 140  K 4.5 3.9  CL 104 105  CO2 24 23  BUN 14 18  CREATININE 0.98 1.27*  CALCIUM 8.9 8.5  PROT 6.7  --   BILITOT <0.2*  --   ALKPHOS 93  --   ALT 26  --   AST 31  --   GLUCOSE 96 96   UTox - THC (positive), all other negative  FOBT - Positive  Troponin-I - negative x 2 (x1  pending)   Imaging/Diagnostic Tests:  2/8 EKG  NSR, HR 95, normal axis, prolonged QT with QTC 513, no acute ST-T wave findings  2/8 Head CT w/o contrast  IMPRESSION:  Patchy periventricular small vessel disease. Asymmetric decreased  attenuation in the posterior mid right cerebellum compare of the  left is stable and may represent prior infarct in this area. No  acute appearing infarct is seen on this study. No hemorrhage or mass  Effect.  2/8 CXR Portable 1v  IMPRESSION:  Left base infiltrate.  Saralyn Pilar, DO  03/22/2013, 4:30 PM  PGY-1, [facility] Family Medicine  FPTS Intern pager: [phone], text pages welcome  Saralyn Pilar, DO 03/23/2013, 11:30 AM PGY-1, [facility] Family Medicine FPTS Intern pager: [phone], text pages welcome

## 2013-03-23 NOTE — Progress Notes (Signed)
We were asked by FPTS to see patient b/o heme + stool, anemia, and downward drift in Hgb since admission.  I came by the patient's room twice this afternoon, but both times she was off the floor for tests.  I will plan to do a consultation on her tomorrow as requested; if there is need for urgent input from us prior to then, feel free to call our office number [phone].  Florencia Reasonsobert V. Kammie Scioli, M.D. [phone]

## 2013-03-23 NOTE — Progress Notes (Signed)
Echo Lab  2D Echocardiogram completed.  Sameera Betton L Edwen Mclester, RDCS 03/23/2013 3:32 PM

## 2013-03-23 NOTE — Progress Notes (Signed)
Pt stated she is nervous about test results from MRI of head. Says she buried her brother last Wednesday & that he died from a brain tumor.

## 2013-03-23 NOTE — Procedures (Signed)
ELECTROENCEPHALOGRAM REPORT  Patient: Ann ErbLinda [name]       Room #: [id number] EEG No. ID: [id number] Age: 58 y.o.        Sex: female Referring Physician: Hensel Report Date:  03/23/2013        Interpreting Physician: Aline BrochureSTEWART,Pragya Lofaso R  History: Ann ErbLinda Mcburney is an 58 y.o. female history probable TIA in September 2014 who was admitted on 03/22/2013 following an episode of falling or loss of consciousness with confusion afterwards.  Indications for study:  Rule out new onset seizure disorder  Technique: This is an 18 channel routine scalp EEG performed at the bedside with bipolar and monopolar montages arranged in accordance to the international 10/20 system of electrode placement.   Description: This EEG recording was performed during wakefulness. Predominate background activity consisted of 10 Hz symmetrical alpha rhythm which attenuates well with eye opening. Photic stimulation produced symmetrical occipital driving response. Hyperventilation was not performed. No epileptiform discharges were recorded. There was no slowing of cerebral activity focally nor diffusely.  Interpretation: This is a normal EEG recording during wakefulness. No evidence of an epileptic disorder was demonstrated.   Venetia MaxonR Jakevion Arney M.D. [facility] [phone]

## 2013-03-23 NOTE — Consult Note (Signed)
NEURO HOSPITALIST CONSULT NOTE    Reason for Consult: syncope versus seizure  HPI:                                                                                                                                          Ann [name] is an 58 y.o. female who was seen back in September due to sudden loss of consciousness followed by left sided weakness.  At that time MRI/MRA showed no acute abnormalities, Echo was normal,  Carotid dopplers normal, and EEG was normal.  Her left sided weakness resolved within 24 hours. Patient returns to the hospital after patient fell to the ground and there was questionable jerking of arms and legs that lasted for about one minute. patient recalls going into her house and starting to put groceries away in the refrigerator.  She does not recall anything after this until she was placed in the ambulance.  Upon waking she was minimally confused, denies any incontinence and did not bite her tongue.  She had no residual weakness. She states her son mentioned she had bilateral arm shaking and foaming at the mouth for about 45 sec to 1 minute. Currently she is back to baseline. Patient does admit to being under more stress than usual and having very little sleep.   Orthostatics are normal.   Past Medical History  Diagnosis Date  . Coronary artery disease   . Pyelonephritis   . Pyelonephritis 1    Past Surgical History  Procedure Laterality Date  . Abdominal hysterectomy    . Coronary artery bypass graft  1    Family History  Problem Relation Age of Onset  . Hypertension Mother   . Hypertension Father      Social History:  reports that she has been smoking Cigarettes.  She has been smoking about 0.00 packs per day. She does not have any smokeless tobacco history on file. She reports that she uses illicit drugs (Marijuana). She reports that she does not drink alcohol.  Allergies  Allergen Reactions  . Codeine Nausea And Vomiting  .  Morphine And Related     Makes me me crazy  . Aleve [Naproxen Sodium] Rash    MEDICATIONS:  Scheduled: . atorvastatin  40 mg Oral QHS  . cefTRIAXone (ROCEPHIN)  IV  1 g Intravenous Q24H  . nicotine  14 mg Transdermal Daily  . pantoprazole (PROTONIX) IV  40 mg Intravenous Q12H  . sodium chloride  3 mL Intravenous Q12H  . thiamine  100 mg Intravenous Daily     ROS:                                                                                                                                       History obtained from the patient  General ROS: negative for - chills, fatigue, fever, night sweats, weight gain or weight loss Psychological ROS: negative for - behavioral disorder, hallucinations, memory difficulties, mood swings or suicidal ideation Ophthalmic ROS: negative for - blurry vision, double vision, eye pain or loss of vision ENT ROS: negative for - epistaxis, nasal discharge, oral lesions, sore throat, tinnitus or vertigo Allergy and Immunology ROS: negative for - hives or itchy/watery eyes Hematological and Lymphatic ROS: negative for - bleeding problems, bruising or swollen lymph nodes Endocrine ROS: negative for - galactorrhea, hair pattern changes, polydipsia/polyuria or temperature intolerance Respiratory ROS: negative for - cough, hemoptysis, shortness of breath or wheezing Cardiovascular ROS: negative for - chest pain, dyspnea on exertion, edema or irregular heartbeat Gastrointestinal ROS: negative for - abdominal pain, diarrhea, hematemesis, nausea/vomiting or stool incontinence Genito-Urinary ROS: negative for - dysuria, hematuria, incontinence or urinary frequency/urgency Musculoskeletal ROS: negative for - joint swelling or muscular weakness Neurological ROS: as noted in HPI Dermatological ROS: negative for rash and skin lesion changes   Blood pressure  95/50, pulse 88, temperature 98 F (36.7 C), temperature source Oral, resp. rate 18, height 5\' 3"  (1.6 m), weight 79.107 kg (174 lb 6.4 oz), SpO2 96.00%.   Neurologic Examination:                                                                                                      Mental Status: Alert, oriented, thought content appropriate.  Speech fluent without evidence of aphasia.  Able to follow 3 step commands without difficulty. Cranial Nerves: II: Discs flat bilaterally; Visual fields grossly normal, pupils equal, round, reactive to light and accommodation III,IV, VI: ptosis not present, extra-ocular motions intact bilaterally V,VII: smile symmetric, facial light touch sensation normal bilaterally VIII: hearing normal bilaterally IX,X: gag reflex present XI: bilateral shoulder shrug XII: midline tongue extension without atrophy or fasciculations  Motor: Right : Upper extremity   5/5  Left:     Upper extremity   5/5  Lower extremity   5/5     Lower extremity   5/5 --poor effort noted when testing her bilateral hip flexors Tone and bulk:normal tone throughout; no atrophy noted Sensory: Pinprick and light touch intact throughout, bilaterally Deep Tendon Reflexes:  Right: Upper Extremity   Left: Upper extremity   biceps (C-5 to C-6) 2/4   biceps (C-5 to C-6) 2/4 tricep (C7) 2/4    triceps (C7) 2/4 Brachioradialis (C6) 2/4  Brachioradialis (C6) 2/4  Lower Extremity Lower Extremity  quadriceps (L-2 to L-4) 2/4   quadriceps (L-2 to L-4) 2/4 Achilles (S1) 2/4   Achilles (S1) 2/4  Plantars: Right: downgoing   Left: downgoing Cerebellar: normal finger-to-nose,  normal heel-to-shin test CV: pulses palpable throughout    Lab Results: Basic Metabolic Panel:  Recent Labs Lab 03/22/13 1215 03/23/13 0054 03/23/13 0055  NA 141 140  --   K 4.5 3.9  --   CL 104 105  --   CO2 24 23  --   GLUCOSE 96 96  --   BUN 14 18  --   CREATININE 0.98 1.27*  --   CALCIUM 8.9 8.5  --    MG  --   --  2.0    Liver Function Tests:  Recent Labs Lab 03/22/13 1215  AST 31  ALT 26  ALKPHOS 93  BILITOT <0.2*  PROT 6.7  ALBUMIN 3.5   No results found for this basename: LIPASE, AMYLASE,  in the last 168 hours No results found for this basename: AMMONIA,  in the last 168 hours  CBC:  Recent Labs Lab 03/22/13 1215 03/22/13 1811 03/23/13 0054 03/23/13 0935  WBC 7.5 6.1 5.6 5.2  NEUTROABS 5.9  --  2.9  --   HGB 8.8* 8.6* 8.4* 7.9*  HCT 26.9* 26.5* 25.9* 24.9*  MCV 82.5 82.8 83.5 84.1  PLT 186 185 182 173    Cardiac Enzymes:  Recent Labs Lab 03/22/13 1811 03/23/13 0054 03/23/13 0935  TROPONINI <0.30 <0.30 <0.30    Lipid Panel:  Recent Labs Lab 03/23/13 0054  CHOL 170  TRIG 126  HDL 62  CHOLHDL 2.7  VLDL 25  LDLCALC 83    CBG:  Recent Labs Lab 03/22/13 1317  GLUCAP 101*    Microbiology: No results found for this or any previous visit.  Coagulation Studies:  Recent Labs  03/22/13 1215  LABPROT 12.5  INR 0.95    Imaging: Ct Head Wo Contrast  03/22/2013   CLINICAL DATA:  Seizure  EXAM: CT HEAD WITHOUT CONTRAST  TECHNIQUE: Contiguous axial images were obtained from the base of the skull through the vertex without intravenous contrast. Study was obtained within 24 hr of patient's arrival at the emergency department.  COMPARISON:  Brain CT and brain MRI October 16, 2012  FINDINGS: The ventricles are normal in size and configuration. There is no demonstrable mass, hemorrhage, extra-axial fluid collection, or midline shift. Asymmetric decreased attenuation in the posterior mid right cerebellum compared to the left is stable and may represent prior infarct. There is patchy small vessel disease in the centra semiovale bilaterally. There is no demonstrable acute infarct.  Bony calvarium appears intact.  The mastoid air cells are clear.  IMPRESSION: Patchy periventricular small vessel disease. Asymmetric decreased attenuation in the posterior mid  right cerebellum compare of the left is stable and may represent prior infarct in this area. No acute appearing infarct is seen on this study.  No hemorrhage or mass effect.   Electronically Signed   By: Bretta Bang M.D.   On: 03/22/2013 12:57   Dg Chest Portable 1 View  03/22/2013   CLINICAL DATA:  Productive cough and seizure  EXAM: PORTABLE CHEST - 1 VIEW  COMPARISON:  December 11, 2012  FINDINGS: There is patchy infiltrate in the left base. Lungs are otherwise clear. Heart size and pulmonary vascularity are normal. No adenopathy. Patient is status post median sternotomy.  IMPRESSION: Left base infiltrate.   Electronically Signed   By: Bretta Bang M.D.   On: 03/22/2013 15:28    Assessment and plan per attending neurologist  Felicie Morn PA-C [facility] [phone]  03/23/2013, 11:28 AM   Assessment/Plan:  58 YO female presenting with episode of loss of consciousness associated with jerking motions.  Patient had a full work up in September which did not reveal etiology of altered mental status.  As this is her second episode with similar description, cannot rule out seizure.   Recommend: 1) EEG 2)  MRI brain with and without contrast.   I personally participate in this patient's evaluation and management, including formulating the above clinical assessment and management recommendations.  Venetia Maxon M.D. [facility] [phone]

## 2013-03-23 NOTE — H&P (Signed)
Family Medicine Teaching Service Attending Note  I interviewed and examined patient Ann [name] and reviewed their tests and x-rays.  I discussed with Dr. Celesta GentileBradshaw/Cook/K and reviewed their note for today.  I agree with their assessment and plan.     Additionally  Feeling better Does not remember episode.  Had similar several months ago  ?Syncope - neurology consulted.  Does not sound like a seizure  ? Orthostatic with low hgb and illness  Anemia - ? Gi source Check iron studies, hatoglobin, monitor and consult gi since she is new to town  CAP - stop azithro since not likel atypical and history of prolnged QT

## 2013-03-23 NOTE — Progress Notes (Signed)
EEG Completed; Results Pending  

## 2013-03-24 LAB — CBC
HCT: 25 % — ABNORMAL LOW (ref 36.0–46.0)
HEMATOCRIT: 27.3 % — AB (ref 36.0–46.0)
HEMOGLOBIN: 8.1 g/dL — AB (ref 12.0–15.0)
Hemoglobin: 8.5 g/dL — ABNORMAL LOW (ref 12.0–15.0)
MCH: 27.2 pg (ref 26.0–34.0)
MCH: 26.2 pg (ref 26.0–34.0)
MCHC: 32.4 g/dL (ref 30.0–36.0)
MCHC: 31.1 g/dL (ref 30.0–36.0)
MCV: 83.9 fL (ref 78.0–100.0)
MCV: 84 fL (ref 78.0–100.0)
Platelets: 167 10*3/uL (ref 150–400)
Platelets: 192 10*3/uL (ref 150–400)
RBC: 2.98 MIL/uL — AB (ref 3.87–5.11)
RBC: 3.25 MIL/uL — AB (ref 3.87–5.11)
RDW: 16 % — ABNORMAL HIGH (ref 11.5–15.5)
RDW: 15.9 % — ABNORMAL HIGH (ref 11.5–15.5)
WBC: 5.1 10*3/uL (ref 4.0–10.5)
WBC: 4.9 10*3/uL (ref 4.0–10.5)

## 2013-03-24 LAB — CBC WITH DIFFERENTIAL/PLATELET
BASOS ABS: 0 10*3/uL (ref 0.0–0.1)
BASOS PCT: 1 % (ref 0–1)
Eosinophils Absolute: 0.2 10*3/uL (ref 0.0–0.7)
Eosinophils Relative: 3 % (ref 0–5)
HCT: 25.6 % — ABNORMAL LOW (ref 36.0–46.0)
HEMOGLOBIN: 8.2 g/dL — AB (ref 12.0–15.0)
LYMPHS PCT: 34 % (ref 12–46)
Lymphs Abs: 2.1 10*3/uL (ref 0.7–4.0)
MCH: 26.9 pg (ref 26.0–34.0)
MCHC: 32 g/dL (ref 30.0–36.0)
MCV: 83.9 fL (ref 78.0–100.0)
MONOS PCT: 8 % (ref 3–12)
Monocytes Absolute: 0.5 10*3/uL (ref 0.1–1.0)
NEUTROS ABS: 3.4 10*3/uL (ref 1.7–7.7)
NEUTROS PCT: 55 % (ref 43–77)
Platelets: 161 10*3/uL (ref 150–400)
RBC: 3.05 MIL/uL — ABNORMAL LOW (ref 3.87–5.11)
RDW: 16.2 % — AB (ref 11.5–15.5)
WBC: 6.2 10*3/uL (ref 4.0–10.5)

## 2013-03-24 LAB — BASIC METABOLIC PANEL
BUN: 15 mg/dL (ref 6–23)
CO2: 25 mEq/L (ref 19–32)
Calcium: 8.8 mg/dL (ref 8.4–10.5)
Chloride: 107 mEq/L (ref 96–112)
Creatinine, Ser: 0.97 mg/dL (ref 0.50–1.10)
GFR, EST AFRICAN AMERICAN: 73 mL/min — AB (ref >90)
GFR, EST NON AFRICAN AMERICAN: 63 mL/min — AB (ref >90)
Glucose, Bld: 83 mg/dL (ref 70–99)
Potassium: 4.5 mEq/L (ref 3.7–5.3)
SODIUM: 143 meq/L (ref 137–147)

## 2013-03-24 MED ORDER — SODIUM CHLORIDE 0.9 % IV SOLN
INTRAVENOUS | Status: DC
  Administered 2013-03-24: 16:00:00 via INTRAVENOUS

## 2013-03-24 MED ORDER — PEG 3350-KCL-NA BICARB-NACL 420 G PO SOLR
4000.0000 mL | Freq: Once | ORAL | Status: AC
  Administered 2013-03-24: 4000 mL via ORAL
  Filled 2013-03-24: qty 4000

## 2013-03-24 NOTE — Progress Notes (Signed)
Family Medicine Teaching Service Daily Progress Note Intern Pager: [phone]  Patient name: Ann [name] Medical record number: [id number] Date of birth: [birth year] Age: 58 y.o. Gender: female  Primary Care Provider: Pcp Not In System Consultants: Neurology Code Status: Full (confirmed on admission)  Pt Overview and Major Events to Date:  2/8 - admitted, Hgb 8.8-->8.6, FOBT (positive), started CTX / Azithro 2/9 - Hgb 8.4-->7.9, ordered iron studies, DC'd Azithro, c/s Neuro (ordered MRI Brain), c/s GI 2/10 - EEG (normal), MRI (no acute findings), GI recommend EGD / Colonoscopy on 2/11  Assessment and Plan: Ann [name] is a 58 y.o. female presenting with pre-syncopal vs syncopal episode with possible seizure-like activity, complains of worsened productive cough x 1 week and chest pain . PMH is significant for COPD (2/2 tobacco abuse), GERD, anemia, CVA, anxiety, chronic pain syndrome, chest surgery for cardiac tumor removal. (HX of CAD with CABG documented in Hx section but she denies this)  # Symptomatic Anemia, secondary to suspected UGI bleed, in setting of h/o GERD- likely chronic/subacute  Presents with decreased Hgb to 8.8 on admission with FOBT (positive), reports of melena without BRBPR or hematemesis, no overt clinical evidence of bleeding. Previous Hgb baseline ~ 12 (2014). Unclear if h/o PUD, however significant h/o frequent NSAID use, and previously treated with supplemental iron for anemia in past. - telemetry, closely monitor VS for any acute changes that suggest volume loss or persistent bleeding  - continued decreasing Hgb trend 8.8-->8.6-->8.4-->7.9-->8.7-->8.2, suspect initial dilutional effect, however persistent decline within 24 hrs without acute active bleeding - monitor CBC daily - iron studies c/w chronic iron deficiency anemia, more likely from acute on chronic loss but consider alternative options such as hemolysis (will order haptoglobin to rule this out)      -  Ferritin 10 (very low end of normal)      - Iron 27 (low), Sat ratio (7%, low), TIBC 382 (high normal) - Protonix IV 40mg  BID - if Hgb acutely worsening or Hgb <7.0 consider transfusion threshold, given pt is currently symptomatic - Patient reports h/o prior Colonoscopy (5 years ago - told had 2 polyps, removed), EGD (unknown results) - [place]. Will investigate for record request. - c/s GI Deboraha Sprang([facility]) re: eval of GI bleed - greatly appreciate all recommendations      - Recommend EGD / Colonoscopy tomorrow 2/11, discussed potential concern in setting of PNA, however clinically resp status significantly improved. GI will discuss with anesthesia to evaluate patient prior to pre-op tomorrow.      - start bowel prep, clear liquid diet, NPO after midnight  # Presumed LLL CAP  Clinical history/exam supportive of CAP with worsening productive cough, subjective h/o fevers/chills, also chest pain. CXR with evidence of L-basilar infiltrate (compared to prior 12/11/12). WBC not elevated at 7.5-->6.1  - currently afebrile, f/u fever curve  - droplet precautions  - influenza panel (negative)  - continue antibiotic coverage for CAP on (2/8>>):       - continue CTX 1g daily      - (2/9) DC'd Azithro PO - given concern of prolonged QT syndrome - consider repeat CXR 2view if worsening  # Chest pain, ruled out MI  Clinically CP appears atypical in description, however not reproducible on exam. Unclear history with prior documentation of CAD and s/p CABG, however patient denies having an MI. Note patient does have evidence of thoracotomy, and charted h/o chest surgery for heart tumor removal. Significant risk  factors (+smoker, >50 yrs, HLD).  - Hold ASA, previously continued ASA 81mg  until MI ruled out, and then hold d/t current GI bleed. Believe that benefit in setting of potential acute chest pain outweighs inc bleeding risk. - Troponin-I (negative x 3) - Repeat EKG  PRN symptoms - h/o prolonged QTc - risk stratification labs: HgbA1c (5.8), TSH (2.341), lipid panel (TC 170, TG 126, HDL 62, LDL 83)  # Seizure vs Syncope  Unclear etiology given history of episode this AM. No reported LOC or prior aura-like symptoms, suspect more likely pre-syncopal episode. Concern for seizure-like activity related to episode with b/l ext shaking resolved < 1 min, increased likelihood for potential seizure with described post-ictal phase following episode.  Differential includes cardiogenic vs neurologic etiology of episode, including CVA, dysrhythmia (concern with identified prolonged QT on EKG). Head CT negative for acute infarct vs bleed - seizure precautions - Orthostatic VS - Negative (100/52 --> 91/52 --> 99/44, with stable pulse) - ordered 2D ECHO, syncope work-up, rule out embolic etiology  - Evidence of QT prolongation with QTc 513 on EKG - Hold amitriptyline - Mag 2.0 / K 3.9 - no inc risk for dssarrhythmia - c/s Neurology re: potential seizure vs pre-syncope/syncope - greatly appreciate all recommendations    - MRI Brain w/o contrast (no acute findings)    - EEG (normal, without evidence of seizure)    - Neurology signed off, no further concerns, suspect likely syncopal episode, continue with current management  # H/o COPD  Suspect that respiratory symptoms are more related to focal findings of CAP plus febrile, possible COPD exacerbation with inc cough / sputum production.  - ordered scheduled Duonebs q 4 hr x 3 doses, to determine if improving, and would consider potential mild COPD exacerbation - albuterol neb q 2 hr PRN - consider course of steroids  # Tobacco Abuse, chronic  - H/o 1ppd x 40 years  - Nicotine Patch  # HLD  - Continue home atorvastatin 40mg  daily  # Chronic Pain Syndrome  - Continue Norco 5-325mg  q 4 hr PRN  # Psych: Anxiety, Insomnia  - Continue Xanax 1mg  TID PRN anxiety  - Hold Amitriptyline given QT prolong risk  - Ordered Ambien  5mg  nightly PRN  FEN/GI: KVO, regular diet  Prophylaxis: SCDs (no pharm VTE prophylaxis in setting of likely GI bleed)  Disposition: Admit to FPTS, inpatient status for close monitoring and further diagnostic work-up, pending clinical improvement, expect patient to be discharged to home in 1 day, pending completion of EGD / Colonoscopy.  Subjective: No acute events overnight. This morning she reports feeling significantly better, endorses improved cough that is more productive and provides better relief. CP significantly reduced and no longer constant, only occasional pains, not reproducible. Denies fever/chills, shortness of breath, active CP, HA, numbness, tingling, seizure-activity, dizziness, weakness, abd pain. Tolerating ambulation well. Ate breakfast this morning, but understands that she will now be NPO temporarily as there is a possibility of EGD today vs tomorrow. She is anxious to go home.  Objective: Temp:  [97.1 F (36.2 C)-97.7 F (36.5 C)] 97.1 F (36.2 C) (02/10 0559) Pulse Rate:  [84-85] 84 (02/10 0559) Resp:  [18-21] 21 (02/10 0559) BP: (110-118)/(51-56) 118/51 mmHg (02/10 0559) SpO2:  [95 %-100 %] 100 % (02/10 0559) Weight:  [174 lb 2.6 oz (79 kg)] 174 lb 2.6 oz (79 kg) (02/10 0559) Physical Exam: General: pleasant and cooperative, sitting up in bed, increased activity, NAD Cardiovascular: RRR, no murmurs heard  Respiratory:  Mostly CTAB except focal LLL crackles heard. Otherwise clear without wheezing or rhonchi. Improved air movement b/l with normal WOB. Abdomen: soft, NTND, +active BS  Extremities: non-tender, no edema, moves all ext, +2 peripheral pulses intact  Skin: warm, dry, no rashes  Neuro: awake, alert, oriented, CN II-XII grossly intact, normal muscle strength 5/5 b/l ext, and intact distal sensation  Laboratory:  Recent Labs Lab 03/23/13 1830 03/24/13 0053 03/24/13 0900  WBC 6.2 6.2 5.1  HGB 8.7* 8.2* 8.1*  HCT 27.6* 25.6* 25.0*  PLT 195 161 167     Recent Labs Lab 03/22/13 1215 03/23/13 0054 03/24/13 0053  NA 141 140 143  K 4.5 3.9 4.5  CL 104 105 107  CO2 24 23 25   BUN 14 18 15   CREATININE 0.98 1.27* 0.97  CALCIUM 8.9 8.5 8.8  PROT 6.7  --   --   BILITOT <0.2*  --   --   ALKPHOS 93  --   --   ALT 26  --   --   AST 31  --   --   GLUCOSE 96 96 83   UTox - THC (positive), all other negative  FOBT - Positive  Troponin-I - negative x 3   Imaging/Diagnostic Tests:  2/8 EKG  NSR, HR 95, normal axis, prolonged QT with QTC 513, no acute ST-T wave findings  2/8 Head CT w/o contrast  IMPRESSION:  Patchy periventricular small vessel disease. Asymmetric decreased  attenuation in the posterior mid right cerebellum compare of the  left is stable and may represent prior infarct in this area. No  acute appearing infarct is seen on this study. No hemorrhage or mass  Effect.  2/8 CXR Portable 1v  IMPRESSION:  Left base infiltrate.  Saralyn Pilar, DO  03/22/2013, 4:30 PM  PGY-1, [facility]  FPTS Intern pager: [phone], text pages welcome  2/9 2D ECHO Study Conclusions - Left ventricle: The cavity size was normal. Systolic function was normal. The estimated ejection fraction was in the range of 55% to 60%. Wall motion was normal; there were no regional wall motion abnormalities. - Mitral valve: Mild regurgitation. - Right atrium: There is a calcified linear density seen within the RA which has flow within it by colorflow doppler. This was seen in prior study 10/2012 but is more prominent now and only seen in the parasternal short axis veiw. Recommend Cardiac CTA for further evaluation - Atrial septum: No defect or patent foramen ovale was identified.  2/9 MRI Brain w/o contrast IMPRESSION:  No acute infarct.  No evidence of mesial temporal sclerosis.  Prominence of the horizontal fissure greater on the right. Remote  right cerebellar infarct may have contributed to this appearance.   Minimal change in appearance of moderate number of punctate and  patchy nonspecific white matter type changes. This may reflect  result of small vessel disease in this hypertensive, smoker with  heart disease. Other causes of white matter type changes as noted  above.  On the gradient sequence, tiny area of blood breakdown products  versus prominent vessel within sulci right frontal lobe and left  occipital lobe (series 9, image 11). Tiny cavernomas not excluded.  Appearance without change.  No intracranial enhancing lesion.   Saralyn Pilar, DO 03/24/2013, 1:40 PM PGY-1, [facility] Family Medicine FPTS Intern pager: [phone], text pages welcome

## 2013-03-24 NOTE — Progress Notes (Signed)
NEURO HOSPITALIST PROGRESS NOTE   SUBJECTIVE:                                                                                                                        No further episodes of syncope or jerking. Patient feels back to her baseline.   OBJECTIVE:                                                                                                                           Vital signs in last 24 hours: Temp:  [97.1 F (36.2 C)-97.9 F (36.6 C)] 97.1 F (36.2 C) (02/10 0559) Pulse Rate:  [84-88] 84 (02/10 0559) Resp:  [18-21] 21 (02/10 0559) BP: (95-118)/(45-56) 118/51 mmHg (02/10 0559) SpO2:  [91 %-100 %] 100 % (02/10 0559) Weight:  [79 kg (174 lb 2.6 oz)] 79 kg (174 lb 2.6 oz) (02/10 0559)  Intake/Output from previous day: 02/09 0701 - 02/10 0700 In: 720 [P.O.:720] Out: 2550 [Urine:2550] Intake/Output this shift: Total I/O In: 480 [P.O.:480] Out: 850 [Urine:850] Nutritional status: Clear Liquid  Past Medical History  Diagnosis Date  . Coronary artery disease   . Pyelonephritis   . Pyelonephritis 1     Neurologic Exam:  Mental Status: Alert, oriented, thought content appropriate.  Speech fluent without evidence of aphasia.  Able to follow 3 step commands without difficulty. Cranial Nerves: II: Discs flat bilaterally; Visual fields grossly normal, pupils equal, round, reactive to light and accommodation III,IV, VI: ptosis not present, extra-ocular motions intact bilaterally V,VII: smile symmetric, facial light touch sensation normal bilaterally VIII: hearing normal bilaterally IX,X: gag reflex present XI: bilateral shoulder shrug XII: midline tongue extension without atrophy or fasciculations  Motor: Right : Upper extremity   5/5    Left:     Upper extremity   5/5  Lower extremity   5/5     Lower extremity   5/5 Tone and bulk:normal tone throughout; no atrophy noted Sensory: Pinprick and light touch intact throughout,  bilaterally Deep Tendon Reflexes:  Right: Upper Extremity   Left: Upper extremity   biceps (C-5 to C-6) 2/4   biceps (C-5 to C-6) 2/4 tricep (C7) 2/4    triceps (C7) 2/4 Brachioradialis (C6) 2/4  Brachioradialis (C6) 2/4  Lower Extremity Lower Extremity  quadriceps (L-2 to L-4) 2/4   quadriceps (L-2 to L-4) 2/4 Achilles (S1) 2/4   Achilles (S1) 2/4  Plantars: Right: downgoing   Left: downgoing Cerebellar: normal finger-to-nose,  normal heel-to-shin test CV: pulses palpable throughout    Lab Results: Basic Metabolic Panel:  Recent Labs Lab 03/22/13 1215 03/23/13 0054 03/23/13 0055 03/24/13 0053  NA 141 140  --  143  K 4.5 3.9  --  4.5  CL 104 105  --  107  CO2 24 23  --  25  GLUCOSE 96 96  --  83  BUN 14 18  --  15  CREATININE 0.98 1.27*  --  0.97  CALCIUM 8.9 8.5  --  8.8  MG  --   --  2.0  --     Liver Function Tests:  Recent Labs Lab 03/22/13 1215  AST 31  ALT 26  ALKPHOS 93  BILITOT <0.2*  PROT 6.7  ALBUMIN 3.5   No results found for this basename: LIPASE, AMYLASE,  in the last 168 hours No results found for this basename: AMMONIA,  in the last 168 hours  CBC:  Recent Labs Lab 03/22/13 1215  03/23/13 0054 03/23/13 0935 03/23/13 1830 03/24/13 0053 03/24/13 0900  WBC 7.5  < > 5.6 5.2 6.2 6.2 5.1  NEUTROABS 5.9  --  2.9  --   --  3.4  --   HGB 8.8*  < > 8.4* 7.9* 8.7* 8.2* 8.1*  HCT 26.9*  < > 25.9* 24.9* 27.6* 25.6* 25.0*  MCV 82.5  < > 83.5 84.1 84.1 83.9 83.9  PLT 186  < > 182 173 195 161 167  < > = values in this interval not displayed.  Cardiac Enzymes:  Recent Labs Lab 03/22/13 1811 03/23/13 0054 03/23/13 0935  TROPONINI <0.30 <0.30 <0.30    Lipid Panel:  Recent Labs Lab 03/23/13 0054  CHOL 170  TRIG 126  HDL 62  CHOLHDL 2.7  VLDL 25  LDLCALC 83    CBG:  Recent Labs Lab 03/22/13 1317  GLUCAP 101*    Microbiology: No results found for this or any previous visit.  Coagulation Studies:  Recent Labs   03/22/13 1215  LABPROT 12.5  INR 0.95    Imaging: Ct Head Wo Contrast  03/22/2013   CLINICAL DATA:  Seizure  EXAM: CT HEAD WITHOUT CONTRAST  TECHNIQUE: Contiguous axial images were obtained from the base of the skull through the vertex without intravenous contrast. Study was obtained within 24 hr of patient's arrival at the emergency department.  COMPARISON:  Brain CT and brain MRI October 16, 2012  FINDINGS: The ventricles are normal in size and configuration. There is no demonstrable mass, hemorrhage, extra-axial fluid collection, or midline shift. Asymmetric decreased attenuation in the posterior mid right cerebellum compared to the left is stable and may represent prior infarct. There is patchy small vessel disease in the centra semiovale bilaterally. There is no demonstrable acute infarct.  Bony calvarium appears intact.  The mastoid air cells are clear.  IMPRESSION: Patchy periventricular small vessel disease. Asymmetric decreased attenuation in the posterior mid right cerebellum compare of the left is stable and may represent prior infarct in this area. No acute appearing infarct is seen on this study. No hemorrhage or mass effect.   Electronically Signed   By: Bretta Bang M.D.   On: 03/22/2013 12:57   Mr Laqueta Jean XB Contrast  03/23/2013   CLINICAL DATA:  New  onset seizure.  EXAM: MRI HEAD WITHOUT AND WITH CONTRAST  TECHNIQUE: Multiplanar, multiecho pulse sequences of the brain and surrounding structures were obtained without and with intravenous contrast.  CONTRAST:  15 MULTIHANCE GADOBENATE DIMEGLUMINE 529 MG/ML IV SOLN  COMPARISON:  03/22/2013 CT.  10/16/2012 MR.  FINDINGS: No acute infarct.  No evidence of mesial temporal sclerosis.  On the gradient sequence, tiny area of blood breakdown products versus prominent vessel within sulci right frontal lobe and left occipital lobe (series 9, image 11). Tiny cavernomas not excluded. Appearance without change.  Prominence of the horizontal fissure  greater on the right. 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  Minimal change in appearance of moderate number of punctate and patchy nonspecific white matter type changes. This may reflect result of small vessel disease in this hypertensive, smoker with heart disease. Other causes of white matter type changes not entirely excluded although felt to be less likely include that secondary to; vasculitis, inflammatory process, demyelinating process, migraine headaches or result of prior trauma/infection.  No intracranial enhancing lesion.  No hydrocephalus.  Major intracranial vascular structures are patent.  Mild transverse ligament hypertrophy. Cervical medullary junction, pituitary region, pineal region and orbital structures unremarkable.  Minimal paranasal sinus mucosal thickening.  IMPRESSION: No acute infarct.  No evidence of mesial temporal sclerosis.  Prominence of the horizontal fissure greater on the right. 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  Minimal change in appearance of moderate number of punctate and patchy nonspecific white matter type changes. This may reflect result of small vessel disease in this hypertensive, smoker with heart disease. Other causes of white matter type changes as noted above.  On the gradient sequence, tiny area of blood breakdown products versus prominent vessel within sulci right frontal lobe and left occipital lobe (series 9, image 11). Tiny cavernomas not excluded. Appearance without change.  No intracranial enhancing lesion.   Electronically Signed   By: Bridgett Larsson M.D.   On: 03/23/2013 19:09   Dg Chest Portable 1 View  03/22/2013   CLINICAL DATA:  Productive cough and seizure  EXAM: PORTABLE CHEST - 1 VIEW  COMPARISON:  December 11, 2012  FINDINGS: There is patchy infiltrate in the left base. Lungs are otherwise clear. Heart size and pulmonary vascularity are normal. No adenopathy. Patient is status post  median sternotomy.  IMPRESSION: Left base infiltrate.   Electronically Signed   By: Bretta Bang M.D.   On: 03/22/2013 15:28       MEDICATIONS                                                                                                                        Scheduled: . atorvastatin  40 mg Oral QHS  . cefTRIAXone (ROCEPHIN)  IV  1 g Intravenous Q24H  . nicotine  14 mg Transdermal Daily  . pantoprazole (PROTONIX) IV  40 mg Intravenous Q12H  . polyethylene glycol-electrolytes  4,000 mL Oral Once  .  sodium chloride  3 mL Intravenous Q12H  . thiamine  100 mg Intravenous Daily    ASSESSMENT/PLAN:                                                                                                            58 YO female presenting with episode of loss of consciousness associated with jerking motions. Patient had a full work up in September which did not reveal etiology of altered mental status. EEG and MRI were normal. Further evaluation by GI shows heme positive stools with low H/H.  GI plans to do a endoscopy and colonoscopy.  Likely syncopal episode.   No further neurological work up warranted.  Neurology S/O   Assessment and plan discussed with with attending physician and they are in agreement.    Felicie MornDavid Lavon Bothwell PA-C [facility] [phone]  03/24/2013, 12:19 PM

## 2013-03-24 NOTE — Progress Notes (Signed)
Utilization Review Completed Amara Justen J. Janielle Mittelstadt, RN, BSN, NCM [phone]  

## 2013-03-24 NOTE — Consult Note (Signed)
Referring Provider: Family practice teaching service (Dr. Doralee AlbinoWilliam Hensel attending) Primary Care Physician:  Pcp Not In System Primary Gastroenterologist:  None (unassigned)  Reason for Consultation:  Heme positive stool and anemia  HPI: Ann [name] is a 58 y.o. female 2 days ago after a presyncopal episode, at which time her hemoglobin was noted to have dropped significantly from baseline, and she was Hemoccult positive with a history of some dark stools, apparently with some exposure to nonsteroidal anti-inflammatory drugs and a previous history of ulcer disease.   Specifically, the patient's hemoglobin was 15.9 last September, and 11.3 last October, and it was 8.8 on presentation to the emergency room. Since then, it has drifted down slightly to 7.9, but has leveled off there.  She does have a history of reflux which is well controlled by Nexium.   She indicates that she had colon[place] in [place] about 4 years ago at which time a couple of polyps were removed and she was told to have a two-year followup exam, but did not do so.   Iron studies show a low iron saturation and a borderline low ferritin level.  On admission, the patient also showed evidence for a left basilar infiltrate and she is being treated for an apparent community acquired pneumonia.    Past Medical History  Diagnosis Date  . Coronary artery disease   . Pyelonephritis   . Pyelonephritis 1    Past Surgical History  Procedure Laterality Date  . Abdominal hysterectomy    . Coronary artery bypass graft  1    Prior to Admission medications   Medication Sig Start Date End Date Taking? Authorizing Provider  albuterol (PROVENTIL HFA;VENTOLIN HFA) 108 (90 BASE) MCG/ACT inhaler Inhale 2 puffs into the lungs every 6 (six) hours as needed for wheezing or shortness of breath.   Yes Historical Provider, MD  amitriptyline (ELAVIL) 100 MG tablet Take 100 mg by mouth at bedtime.   Yes Historical Provider, MD  aspirin EC 81  MG tablet Take 81 mg by mouth daily.   Yes Historical Provider, MD  atorvastatin (LIPITOR) 40 MG tablet Take 40 mg by mouth at bedtime.   Yes Historical Provider, MD  esomeprazole (NEXIUM) 40 MG capsule Take 40 mg by mouth at bedtime.    Yes Historical Provider, MD  ondansetron (ZOFRAN) 4 MG tablet Take 4 mg by mouth every 8 (eight) hours as needed for nausea or vomiting.   Yes Historical Provider, MD  ALPRAZolam Prudy Feeler(XANAX) 1 MG tablet Take 1 mg by mouth 3 (three) times daily as needed for anxiety.     Historical Provider, MD    Current Facility-Administered Medications  Medication Dose Route Frequency Provider Last Rate Last Dose  . albuterol (PROVENTIL) (2.5 MG/3ML) 0.083% nebulizer solution 2.5 mg  2.5 mg Nebulization Q2H PRN Saralyn PilarAlexander Karamalegos, DO      . ALPRAZolam Prudy Feeler(XANAX) tablet 1 mg  1 mg Oral TID PRN Saralyn PilarAlexander Karamalegos, DO   1 mg at 03/23/13 1845  . atorvastatin (LIPITOR) tablet 40 mg  40 mg Oral QHS Saralyn PilarAlexander Karamalegos, DO   40 mg at 03/23/13 2038  . cefTRIAXone (ROCEPHIN) 1 g in dextrose 5 % 50 mL IVPB  1 g Intravenous Q24H Saralyn PilarAlexander Karamalegos, DO   1 g at 03/23/13 1818  . HYDROcodone-acetaminophen (NORCO/VICODIN) 5-325 MG per tablet 1-2 tablet  1-2 tablet Oral Q4H PRN Saralyn PilarAlexander Karamalegos, DO   2 tablet at 03/24/13 0926  . nicotine (NICODERM CQ - dosed in mg/24 hours) patch 14 mg  14 mg Transdermal Daily Saralyn Pilar, DO   14 mg at 03/24/13 1610  . ondansetron (ZOFRAN) tablet 4 mg  4 mg Oral Q6H PRN Saralyn Pilar, DO       Or  . ondansetron (ZOFRAN) injection 4 mg  4 mg Intravenous Q6H PRN Saralyn Pilar, DO      . pantoprazole (PROTONIX) injection 40 mg  40 mg Intravenous Q12H Saralyn Pilar, DO   40 mg at 03/24/13 9604  . sodium chloride 0.9 % injection 3 mL  3 mL Intravenous Q12H Saralyn Pilar, DO   3 mL at 03/24/13 5409  . thiamine (B-1) injection 100 mg  100 mg Intravenous Daily Celene Kras, MD   100 mg at 03/24/13 8119  . zolpidem  (AMBIEN) tablet 5 mg  5 mg Oral QHS PRN Saralyn Pilar, DO   5 mg at 03/23/13 2306    Allergies as of 03/22/2013 - Review Complete 03/22/2013  Allergen Reaction Noted  . Codeine Nausea And Vomiting 07/20/2012  . Morphine and related  12/11/2012  . Aleve [naproxen sodium] Rash 12/11/2012    Family History  Problem Relation Age of Onset  . Hypertension Mother   . Hypertension Father     History   Social History  . Marital Status: Married    Spouse Name: N/A    Number of Children: N/A  . Years of Education: N/A   Occupational History  . Not on file.   Social History Main Topics  . Smoking status: Current Every Day Smoker    Types: Cigarettes  . Smokeless tobacco: Not on file  . Alcohol Use: No  . Drug Use: Yes    Special: Marijuana  . Sexual Activity: Not on file   Other Topics Concern  . Not on file   Social History Narrative  . No narrative on file    Review of Systems: Variable appetite but apparently no significant weight loss. Occasional medication related constipation. No rectal bleeding. Upper tract symptoms per history of present illness.  Physical Exam: Vital signs in last 24 hours: Temp:  [97.1 F (36.2 C)-97.9 F (36.6 C)] 97.1 F (36.2 C) (02/10 0559) Pulse Rate:  [84-88] 84 (02/10 0559) Resp:  [18-21] 21 (02/10 0559) BP: (95-118)/(45-56) 118/51 mmHg (02/10 0559) SpO2:  [91 %-100 %] 100 % (02/10 0559) Weight:  [79 kg (174 lb 2.6 oz)] 79 kg (174 lb 2.6 oz) (02/10 0559) Last BM Date: 03/22/13 General:   Alert,  Well-developed, well-nourished, pleasant and cooperative in NAD.  Head:  Normocephalic and atraumatic. Eyes:  Sclera clear, no icterus.   Conjunctiva pale. Neck:   No masses or thyromegaly. Lungs:  Scattered rhonchi. No rales. No high-pitched wheezes. No evident respiratory distress. Heart:  Regular rate and rhythm; no murmurs, clicks, rubs,  or gallops. Abdomen:   somewhat adipose, but without guarding, mass effect, or tenderness.    Msk:   Symmetrical without gross deformities. Extremities:   Without clubbing, cyanosis, or edema. Neurologic:  Alert and coherent;  grossly normal neurologically. Skin:  Intact without significant lesions or rashes. Cervical Nodes:  No significant cervical adenopathy. Psych:   Alert and cooperative. Normal mood and affect.However, the nursing staff indicates that she has had a variable mood, sometimes with rather unusual affect.  Intake/Output from previous day: 02/09 0701 - 02/10 0700 In: 720 [P.O.:720] Out: 2550 [Urine:2550] Intake/Output this shift: Total I/O In: 480 [P.O.:480] Out: 400 [Urine:400]  Lab Results:  Recent Labs  03/23/13 1830 03/24/13 0053 03/24/13 0900  WBC 6.2 6.2 5.1  HGB 8.7* 8.2* 8.1*  HCT 27.6* 25.6* 25.0*  PLT 195 161 167   BMET  Recent Labs  03/22/13 1215 03/23/13 0054 03/24/13 0053  NA 141 140 143  K 4.5 3.9 4.5  CL 104 105 107  CO2 24 23 25   GLUCOSE 96 96 83  BUN 14 18 15   CREATININE 0.98 1.27* 0.97  CALCIUM 8.9 8.5 8.8   LFT  Recent Labs  03/22/13 1215  PROT 6.7  ALBUMIN 3.5  AST 31  ALT 26  ALKPHOS 93  BILITOT <0.2*   PT/INR  Recent Labs  03/22/13 1215  LABPROT 12.5  INR 0.95     Studies/Results: Ct Head Wo Contrast  03/22/2013   CLINICAL DATA:  Seizure  EXAM: CT HEAD WITHOUT CONTRAST  TECHNIQUE: Contiguous axial images were obtained from the base of the skull through the vertex without intravenous contrast. Study was obtained within 24 hr of patient's arrival at the emergency department.  COMPARISON:  Brain CT and brain MRI October 16, 2012  FINDINGS: The ventricles are normal in size and configuration. There is no demonstrable mass, hemorrhage, extra-axial fluid collection, or midline shift. Asymmetric decreased attenuation in the posterior mid right cerebellum compared to the left is stable and may represent prior infarct. There is patchy small vessel disease in the centra semiovale bilaterally. There is no  demonstrable acute infarct.  Bony calvarium appears intact.  The mastoid air cells are clear.  IMPRESSION: Patchy periventricular small vessel disease. Asymmetric decreased attenuation in the posterior mid right cerebellum compare of the left is stable and may represent prior infarct in this area. No acute appearing infarct is seen on this study. No hemorrhage or mass effect.   Electronically Signed   By: Bretta Bang M.D.   On: 03/22/2013 12:57   Mr Laqueta Jean JX Contrast  03/23/2013   CLINICAL DATA:  New onset seizure.  EXAM: MRI HEAD WITHOUT AND WITH CONTRAST  TECHNIQUE: Multiplanar, multiecho pulse sequences of the brain and surrounding structures were obtained without and with intravenous contrast.  CONTRAST:  15 MULTIHANCE GADOBENATE DIMEGLUMINE 529 MG/ML IV SOLN  COMPARISON:  03/22/2013 CT.  10/16/2012 MR.  FINDINGS: No acute infarct.  No evidence of mesial temporal sclerosis.  On the gradient sequence, tiny area of blood breakdown products versus prominent vessel within sulci right frontal lobe and left occipital lobe (series 9, image 11). Tiny cavernomas not excluded. Appearance without change.  Prominence of the horizontal fissure greater on the right. 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  Minimal change in appearance of moderate number of punctate and patchy nonspecific white matter type changes. This may reflect result of small vessel disease in this hypertensive, smoker with heart disease. Other causes of white matter type changes not entirely excluded although felt to be less likely include that secondary to; vasculitis, inflammatory process, demyelinating process, migraine headaches or result of prior trauma/infection.  No intracranial enhancing lesion.  No hydrocephalus.  Major intracranial vascular structures are patent.  Mild transverse ligament hypertrophy. Cervical medullary junction, pituitary region, pineal region and orbital structures unremarkable.   Minimal paranasal sinus mucosal thickening.  IMPRESSION: No acute infarct.  No evidence of mesial temporal sclerosis.  Prominence of the horizontal fissure greater on the right. 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  Minimal change in appearance of moderate number of punctate and patchy nonspecific white matter type changes. This may reflect result of small vessel disease  in this hypertensive, smoker with heart disease. Other causes of white matter type changes as noted above.  On the gradient sequence, tiny area of blood breakdown products versus prominent vessel within sulci right frontal lobe and left occipital lobe (series 9, image 11). Tiny cavernomas not excluded. Appearance without change.  No intracranial enhancing lesion.   Electronically Signed   By: Bridgett Larsson M.D.   On: 03/23/2013 19:09   Dg Chest Portable 1 View  03/22/2013   CLINICAL DATA:  Productive cough and seizure  EXAM: PORTABLE CHEST - 1 VIEW  COMPARISON:  December 11, 2012  FINDINGS: There is patchy infiltrate in the left base. Lungs are otherwise clear. Heart size and pulmonary vascularity are normal. No adenopathy. Patient is status post median sternotomy.  IMPRESSION: Left base infiltrate.   Electronically Signed   By: Bretta Bang M.D.   On: 03/22/2013 15:28    Impression: 1. Heme positive stool and significant drop in hemoglobin from baseline 2. Possible recent subacute GI bleed with acute on chronic anemia, with questionable presyncopal or syncopal symptoms prior to admission 3. History of ulcer disease in the past (details not available), with exposure to aspirin and possibly nonsteroidal anti-inflammatory drugs, but on Nexium 4. History of GERD 5. Reported history of colon polyps removed 4 years ago in [place], details not available  Plan: I feel this patient should have both endoscopy and colonoscopy. The purpose and risks of the procedures were reviewed with the patient and she is  agreeable, so they will be performed tomorrow.   LOS: 2 days   Tiffiany Beadles V  03/24/2013, 11:07 AM

## 2013-03-24 NOTE — Progress Notes (Signed)
I cosign with Laura B Caldwell's assessments, medication administration, notes, intake and output, etc for this shift. Apryl Brymer A, RN  

## 2013-03-24 NOTE — Discharge Summary (Signed)
Family Medicine Teaching [facility] Discharge Summary  Patient name: Ann [name] Medical record number: [id number] Date of birth: [birth year] Age: 58 y.o. Gender: female Date of Admission: 03/22/2013  Date of Discharge: 03/25/13 Admitting Physician: Ann Letters, MD  Primary Care Provider: Pcp Not In [name] Consultants: Neurology  Indication for Hospitalization: Chest pain, pre-syncopal episode with possible seizure activity, CAP   Discharge Diagnoses/Problem List:  Symptomatic Anemia, secondary to suspected subacute occult [name] bleed - Improved Presumed LLL CAP - Resolved Chest Pain, ruled out MI - Resolved Possible pre-syncope vs seizure, suspect secondary to anemia - Stable H/o COPD Tobacco Abuse HLD Chronic Pain Syndrome Psych: anxiety and insomnia  Disposition: Home  Discharge Condition: Stable  Discharge Exam: General: pleasant and comfortable, NAD  Cardiovascular: RRR, no murmurs heard  Respiratory: CTAB significantly improved / minimal LLL crackles. Otherwise clear without wheezing or rhonchi. Good air movement b/l with normal WOB.  Abdomen: soft, NTND, +active BS  Extremities: non-tender, no edema, moves all ext, +2 peripheral pulses intact  Skin: warm, dry, no rashes  Neuro: awake, alert, oriented  Brief Hospital Course: Ann [name] is a 58 y.o. female presenting with pre-syncopal vs syncopal episode with possible seizure-like activity, complains of worsened productive cough x 1 week and chest pain . PMH is significant for COPD (2/2 tobacco abuse), GERD, anemia, CVA, anxiety, chronic pain syndrome, chest surgery for cardiac tumor removal. (HX of CAD with CABG documented in Hx section but she denies this)  # Symptomatic Anemia, secondary to suspected subacute occult [name] bleed - Improved Presents with decreased Hgb to 8.8 on admission with FOBT (positive), reports of melena without BRBPR or hematemesis, no overt clinical evidence of bleeding. Previous Hgb  baseline ~ 12 (2014). Significant h/o frequent NSAID use, and previously treated with supplemental iron for anemia in past. Consulted [name] (Ann [name], [facility] [name]) and treated with Protonix IV. No active / overt bleeding during hospitalization, closely followed Hgb trend 8.1>>8.5>>8.6>>8.6 with stabilization. Iron studies showed chronic iron deficiency anemia, without evidence of hemolysis. [name] performed EGD and colonoscopy (03/25/13) without any evidence of acute [name] bleed, note prior colonoscopy about 5 years ago. Unable to diagnostic etiology for suspected chronic [name] bleed, [name] recommend close follow-up on discharge with Hgb trend, FOBT, and if no response to iron supplementation advised [name] referral for capsule endoscopy.  # Presumed LLL CAP - Resolved Clinical history/exam supportive of CAP with worsening productive cough, subjective h/o fevers/chills, also chest pain. CXR with evidence of L-basilar infiltrate (compared to prior 12/11/12). WBC stable range 4.8 to 6.2, negative influenza panel. Treated for CAP with CTX and completed course with significant clinical improvement, and decided not to rx PO antibiotic on discharge.  # Chest pain, ruled out MI - Resolved  Presented with atypical chest pain, unclear history with prior documentation of CAD and s/p CABG, however patient denies having an MI. Note patient does have evidence of thoracotomy, and charted h/o chest surgery for heart tumor removal. Significant risk factors (+smoker, >50 yrs, HLD). Initially treated with ASA, troponin-I (negative x3) and EKG (no acute ischemic changes, but noted mild prolonged QTc 513). After ruled out MI, held ASA treatment temporarily d/t concern of [name] bleed.  # Possible pre-syncope vs seizure, suspect secondary to anemia - Stable Prior hospitalization 10/2012 for similar issue without diagnosed etiology. On admission, unclear etiology given history of episode this AM. No reported LOC or prior aura-like symptoms, suspect more  likely pre-syncopal episode. Concern for seizure-like activity related to episode with  b/l ext shaking resolved < 1 min, and questionable post-ictal. Initial work-up with negative orthostatic vitals and Head CT. Consulted Neurology, negative MRI and EEG (no seizure activity). No further episodes or etiology discovered. Suspected related to symptomatic anemia.  # H/o COPD  Presented with constellation of respiratory symptoms, likely related to CAP, and less likely COPD exacerbation. Resp status continued to significantly improve.  # Tobacco Abuse, chronic  H/o 1ppd x 40 years. Given nicotine patch.  # HLD  Continued home atorvastatin 40mg  daily  # Chronic Pain Syndrome  Continued Norco 5-325mg  q 4 hr PRN  # Psych: Anxiety, Insomnia  Continued Xanax 1mg  TID PRN anxiety. Held Amitriptyline given QT prolong risk, and ordered Ambien 5mg  nightly PRN  Issues for Follow Up: 1. Anemia / Hgb - Discharged with Hgb 8.6 and rx for iron supplement 325mg  TID. Recommend trend Hgb and monitor FOBT, if persistently trending down Hgb with +FOBT, then highly recommend referral to [name] for capsule endoscopy.  Significant Procedures: 03/25/13 - EGD and Colonoscopy - (Ann [name], Ann [name] [name])  Significant Labs and Imaging:   Recent Labs Lab 03/24/13 1930 03/25/13 0055 03/25/13 0110  WBC 4.9 4.7 4.8  HGB 8.5* 8.6* 8.6*  HCT 27.3* 27.5* 27.5*  PLT 192 182 174    Recent Labs Lab 03/22/13 1215 03/23/13 0054 03/23/13 0055 03/24/13 0053 03/25/13 0055  NA 141 140  --  143 143  K 4.5 3.9  --  4.5 4.2  CL 104 105  --  107 105  CO2 24 23  --  25 26  GLUCOSE 96 96  --  83 87  BUN 14 18  --  15 8  CREATININE 0.98 1.27*  --  0.97 0.77  CALCIUM 8.9 8.5  --  8.8 8.6  MG  --   --  2.0  --   --   ALKPHOS 93  --   --   --   --   AST 31  --   --   --   --   ALT 26  --   --   --   --   ALBUMIN 3.5  --   --   --   --    HgbA1c (5.8) TSH (2.341) lipid panel (TC 170, TG 126, HDL 62, LDL 83)  UTox - THC  (positive), all other negative  FOBT - Positive  Troponin-I - negative x 3  Influenza (negative)  Iron/TIBC/Ferritin    Component  Value  Date/Time    IRON  27*  03/23/2013 1155    TIBC  382  03/23/2013 1155    FERRITIN  10  03/23/2013 1155    Haptoglobin 177  Imaging/Diagnostic Tests:  2/8 EKG  NSR, HR 95, normal axis, prolonged QT with QTC 513, no acute ST-T wave findings  2/8 Head CT w/o contrast  IMPRESSION:  Patchy periventricular small vessel disease. Asymmetric decreased  attenuation in the posterior mid right cerebellum compare of the  left is stable and may represent prior infarct in this area. No  acute appearing infarct is seen on this study. No hemorrhage or mass  Effect.  2/8 CXR Portable 1v  IMPRESSION:  Left base infiltrate.  2/9 2D ECHO  Study Conclusions - Left ventricle: The cavity size was normal. Systolic function was normal. The estimated ejection fraction was in the range of 55% to 60%. Wall motion was normal; there were no regional wall motion abnormalities. - Mitral valve: Mild regurgitation. - Right atrium: There is  a calcified linear density seen within the RA which has flow within it by colorflow doppler. This was seen in prior study 10/2012 but is more prominent now and only seen in the parasternal short axis veiw. Recommend Cardiac CTA for further evaluation - Atrial septum: No defect or patent foramen ovale was Identified.  2/9 MRI Brain w/o contrast  IMPRESSION:  No acute infarct.  No evidence of mesial temporal sclerosis.  Prominence of the horizontal fissure greater on the right. Remote  right cerebellar infarct may have contributed to this appearance.  Minimal change in appearance of moderate number of punctate and  patchy nonspecific white matter type changes. This may reflect  result of small vessel disease in this hypertensive, smoker with  heart disease. Other causes of white matter type changes as noted  above.  On the gradient  sequence, tiny area of blood breakdown products  versus prominent vessel within sulci right frontal lobe and left  occipital lobe (series 9, image 11). Tiny cavernomas not excluded.  Appearance without change.  No intracranial enhancing lesion.  Results/Tests Pending at Time of Discharge: 1. [name] pathology biopsies ([name] to contact patient with results)  Discharge Medications:    Medication List         albuterol 108 (90 BASE) MCG/ACT inhaler  Commonly known as:  PROVENTIL HFA;VENTOLIN HFA  Inhale 2 puffs into the lungs every 6 (six) hours as needed for wheezing or shortness of breath.     ALPRAZolam 1 MG tablet  Commonly known as:  XANAX  Take 1 mg by mouth 3 (three) times daily as needed for anxiety.     amitriptyline 100 MG tablet  Commonly known as:  ELAVIL  Take 100 mg by mouth at bedtime.     aspirin EC 81 MG tablet  Take 81 mg by mouth daily.     atorvastatin 40 MG tablet  Commonly known as:  LIPITOR  Take 40 mg by mouth at bedtime.     esomeprazole 40 MG capsule  Commonly known as:  NEXIUM  Take 40 mg by mouth at bedtime.     ferrous sulfate 325 (65 FE) MG tablet  Take 1 tablet (325 mg total) by mouth 3 (three) times daily with meals.     ondansetron 4 MG tablet  Commonly known as:  ZOFRAN  Take 4 mg by mouth every 8 (eight) hours as needed for nausea or vomiting.        Discharge Instructions: Please refer to Patient Instructions section of EMR for full details.  Patient was counseled important signs and symptoms that should prompt return to medical care, changes in medications, dietary instructions, activity restrictions, and follow up appointments.   Follow-Up Appointments: Follow-up Information   Follow up with Florencia ReasonsBUCCINI,ROBERT V, MD. (Gastroenterology ([name]) - You will be contacted with your results from colonoscopy, and potential follow-up information.)    Specialty:  Gastroenterology   Contact information:   [address] [place]  [id number] [phone]       Follow up with [facility]. Schedule an appointment as soon as possible for a visit in 1 week. (New patient to establish Ann for regular follow-up.)    Contact information:   [address] [place], [place] [phone]      Saralyn PilarAlexander Micael Barb, DO 03/27/2013, 5:21 PM PGY-1, [facility]

## 2013-03-24 NOTE — Progress Notes (Signed)
Family Medicine Teaching Service Attending Note  I interviewed and examined patient Ann [name] and reviewed their tests and x-rays.  I discussed with Dr. Kirtland BouchardK and reviewed their note for today.  I agree with their assessment and plan.     Additionally  Feeling better Able to walk around room without lightheadness or chest pain or shortness of breath Anemia seems to be chronic likely gi source Proceed with work up as per Avayagi

## 2013-03-25 ENCOUNTER — Encounter (HOSPITAL_COMMUNITY)
Admission: EM | Disposition: A | Discharge status: Home / Self Care | Payer: Self-pay | Source: Home / Self Care | Attending: Family Medicine

## 2013-03-25 ENCOUNTER — Encounter (HOSPITAL_COMMUNITY): Payer: Self-pay

## 2013-03-25 ENCOUNTER — Encounter (HOSPITAL_COMMUNITY): Payer: Medicare HMO | Admitting: Anesthesiology

## 2013-03-25 ENCOUNTER — Inpatient Hospital Stay (HOSPITAL_COMMUNITY): Payer: Medicare HMO | Admitting: Anesthesiology

## 2013-03-25 HISTORY — PX: ESOPHAGOGASTRODUODENOSCOPY: SHX5428

## 2013-03-25 HISTORY — PX: COLONOSCOPY: SHX5424

## 2013-03-25 LAB — CBC WITH DIFFERENTIAL/PLATELET
Basophils Absolute: 0 10*3/uL (ref 0.0–0.1)
Basophils Relative: 0 % (ref 0–1)
EOS ABS: 0.2 10*3/uL (ref 0.0–0.7)
Eosinophils Relative: 4 % (ref 0–5)
HEMATOCRIT: 27.5 % — AB (ref 36.0–46.0)
HEMOGLOBIN: 8.6 g/dL — AB (ref 12.0–15.0)
Lymphocytes Relative: 40 % (ref 12–46)
Lymphs Abs: 1.9 10*3/uL (ref 0.7–4.0)
MCH: 26.3 pg (ref 26.0–34.0)
MCHC: 31.3 g/dL (ref 30.0–36.0)
MCV: 84.1 fL (ref 78.0–100.0)
MONO ABS: 0.4 10*3/uL (ref 0.1–1.0)
MONOS PCT: 8 % (ref 3–12)
NEUTROS PCT: 49 % (ref 43–77)
Neutro Abs: 2.3 10*3/uL (ref 1.7–7.7)
Platelets: 182 10*3/uL (ref 150–400)
RBC: 3.27 MIL/uL — ABNORMAL LOW (ref 3.87–5.11)
RDW: 16 % — ABNORMAL HIGH (ref 11.5–15.5)
WBC: 4.7 10*3/uL (ref 4.0–10.5)

## 2013-03-25 LAB — BASIC METABOLIC PANEL
BUN: 8 mg/dL (ref 6–23)
CO2: 26 mEq/L (ref 19–32)
Calcium: 8.6 mg/dL (ref 8.4–10.5)
Chloride: 105 mEq/L (ref 96–112)
Creatinine, Ser: 0.77 mg/dL (ref 0.50–1.10)
GFR calc Af Amer: >90 mL/min (ref >90)
GLUCOSE: 87 mg/dL (ref 70–99)
POTASSIUM: 4.2 meq/L (ref 3.7–5.3)
Sodium: 143 mEq/L (ref 137–147)

## 2013-03-25 LAB — CBC
HCT: 27.5 % — ABNORMAL LOW (ref 36.0–46.0)
HEMOGLOBIN: 8.6 g/dL — AB (ref 12.0–15.0)
MCH: 26.3 pg (ref 26.0–34.0)
MCHC: 31.3 g/dL (ref 30.0–36.0)
MCV: 84.1 fL (ref 78.0–100.0)
Platelets: 174 10*3/uL (ref 150–400)
RBC: 3.27 MIL/uL — ABNORMAL LOW (ref 3.87–5.11)
RDW: 16.1 % — ABNORMAL HIGH (ref 11.5–15.5)
WBC: 4.8 10*3/uL (ref 4.0–10.5)

## 2013-03-25 LAB — HAPTOGLOBIN: Haptoglobin: 177 mg/dL (ref 45–215)

## 2013-03-25 SURGERY — COLONOSCOPY
Anesthesia: Monitor Anesthesia Care

## 2013-03-25 MED ORDER — LACTATED RINGERS IV SOLN
INTRAVENOUS | Status: DC | PRN
  Administered 2013-03-25: 09:00:00 via INTRAVENOUS

## 2013-03-25 MED ORDER — FENTANYL CITRATE 0.05 MG/ML IJ SOLN: 25.0000 ug | INTRAMUSCULAR | Status: DC | PRN

## 2013-03-25 MED ORDER — FERROUS SULFATE 325 (65 FE) MG PO TABS: 325.0000 mg | ORAL_TABLET | Freq: Three times a day (TID) | ORAL | Status: DC

## 2013-03-25 MED ORDER — PROPOFOL INFUSION 10 MG/ML OPTIME
INTRAVENOUS | Status: DC | PRN
  Administered 2013-03-25: 75 ug/kg/min via INTRAVENOUS

## 2013-03-25 MED ORDER — MIDAZOLAM HCL 5 MG/5ML IJ SOLN
INTRAMUSCULAR | Status: DC | PRN
  Administered 2013-03-25 (×2): 1 mg via INTRAVENOUS

## 2013-03-25 MED ORDER — MIDAZOLAM HCL 2 MG/2ML IJ SOLN: 0.5000 mg | Freq: Once | INTRAMUSCULAR | Status: DC | PRN

## 2013-03-25 MED ORDER — PROMETHAZINE HCL 25 MG/ML IJ SOLN: 6.2500 mg | INTRAMUSCULAR | Status: DC | PRN

## 2013-03-25 MED ORDER — FENTANYL CITRATE 0.05 MG/ML IJ SOLN
INTRAMUSCULAR | Status: DC | PRN
  Administered 2013-03-25: 50 ug via INTRAVENOUS

## 2013-03-25 MED ORDER — IPRATROPIUM-ALBUTEROL 0.5-2.5 (3) MG/3ML IN SOLN: 3.0000 mL | RESPIRATORY_TRACT | Status: DC | PRN

## 2013-03-25 MED ORDER — PANTOPRAZOLE SODIUM 40 MG PO TBEC
40.0000 mg | DELAYED_RELEASE_TABLET | Freq: Every day | ORAL | Status: DC
Start: 2013-03-25 — End: 2013-03-25
  Administered 2013-03-25: 40 mg via ORAL
  Filled 2013-03-25: qty 1

## 2013-03-25 MED ORDER — LIDOCAINE HCL (CARDIAC) 20 MG/ML IV SOLN
INTRAVENOUS | Status: DC | PRN
  Administered 2013-03-25: 40 mg via INTRAVENOUS

## 2013-03-25 MED ORDER — OXYCODONE HCL 5 MG PO TABS: 5.0000 mg | ORAL_TABLET | Freq: Once | ORAL | Status: DC | PRN

## 2013-03-25 MED ORDER — OXYCODONE HCL 5 MG/5ML PO SOLN: 5.0000 mg | Freq: Once | ORAL | Status: DC | PRN

## 2013-03-25 MED ORDER — BUTAMBEN-TETRACAINE-BENZOCAINE 2-2-14 % EX AERO
INHALATION_SPRAY | CUTANEOUS | Status: DC | PRN
  Administered 2013-03-25: 1 via TOPICAL

## 2013-03-25 MED ORDER — MEPERIDINE HCL 25 MG/ML IJ SOLN: 6.2500 mg | INTRAMUSCULAR | Status: DC | PRN

## 2013-03-25 MED ORDER — VITAMIN B-1 100 MG PO TABS
100.0000 mg | ORAL_TABLET | Freq: Every day | ORAL | Status: DC
  Filled 2013-03-25: qty 1

## 2013-03-25 NOTE — Progress Notes (Signed)
Pt stable . VSS. Pt requested Xanax (1mg ) for anxiety, administered at 1146. Pt went to endoscopy, propofol anesthetic given for procedure.

## 2013-03-25 NOTE — Op Note (Signed)
[facility] [address] [place], [id number]   COLONOSCOPY PROCEDURE REPORT  PATIENT: Ann [name], Ann [name]  MR#: [id number] BIRTHDATE: [birth year] , 57  yrs. old GENDER: Female ENDOSCOPIST: Bernette Redbirdobert Lowen Mansouri, MD REFERRED BY:   family practice teaching service PROCEDURE DATE:  03/25/2013 PROCEDURE:     colonoscopy with polypectomy ASA CLASS: INDICATIONS:  heme positive stool and iron deficiency anemia with negative endoscopy. The patient states that she had colon [place]ps [place] 4 years ago and that a two-year followup exam was recommended MEDICATIONS:    MAC per anesthesia  DESCRIPTION OF PROCEDURE: the procedure had been discussed with the patient who provided written consent and was brought from her hospital room [facility] where time out was performed and MAC anesthesia was provided without any clinical instability throughout the procedure.  The Pentax adult video colonoscope was advanced without too much difficulty around the colon to the cecum and for short distance into a normal-appearing terminal ileum, whereupon pullback was performed. The quality of the prep was very good and it is felt that essentially all areas were well seen.  The only abnormality on this exam was 7 mm sessile polyp in the proximal descending colon, removed by cold snare technique and successfully retrieved. No bleeding.  The exam was otherwise normal. The terminal ileum looked normal, and no other polyps were encountered. No colitis, vascular ectasia, or diverticulosis was seen.  Retroflexion in the rectum and reinspection of the rectum was similarly normal.  The patient tolerated the procedure well.     COMPLICATIONS: None  ENDOSCOPIC IMPRESSION:  1. Small colon polyp removed as described above 2. No source of heme positivity or iron deficiency anemia identified.  RECOMMENDATIONS:  1. Since we have not identified a source for the  patient's heme positivity and iron deficiency on either her endoscopy or colonoscopy today, I do feel the patient should have outpatient followup. This could be done at her primary physician's office or in the family practice clinic, or if desired, at my office (let me know if you wish for me to provide that followup). The patient should have Hemoccult testing and assessment of the response of her hemoglobin level to iron supplementation. If she remains heme positive and/or if her hemoglobin does not improve to the normal range on iron supplementation, consideration could then be given to performing a small bowel capsule endoscopy, for which reason the patient could be referred back to me.  2. Await pathology on colon polyp. I will take care of contacting the patient with that result and arrange an appropriate surveillance colonoscopy, probably 5 years from now, depending on the histology of the polyp    _______________________________ eSigned:  Bernette Redbirdobert Trei Schoch, MD 03/25/2013 10:24 AM     PATIENT NAME:  Ann [name], Ann [name] MR#: [id number]

## 2013-03-25 NOTE — Anesthesia Preprocedure Evaluation (Addendum)
Anesthesia Evaluation  Patient identified by MRN, date of birth, ID band Patient awake    Reviewed: Allergy & Precautions, H&P , NPO status , Patient's Chart, lab work & pertinent test results  History of Anesthesia Complications Negative for: history of anesthetic complications  Airway Mallampati: II TM Distance: >3 FB Neck ROM: Full    Dental  (+) Edentulous Upper, Edentulous Lower   Pulmonary COPD COPD inhaler, Current Smoker,  breath sounds clear to auscultation        Cardiovascular - angina+ CAD and + CABG Rhythm:Regular Rate:Normal  03/23/13 ECHO: EF 55-60%, valves OK   Neuro/Psych negative neurological ROS     GI/Hepatic Neg liver ROS, GERD-  Medicated and Controlled,  Endo/Other  negative endocrine ROS  Renal/GU negative Renal ROS     Musculoskeletal   Abdominal (+) + obese,   Peds  Hematology  (+) Blood dyscrasia (Hb 8.6), anemia ,   Anesthesia Other Findings   Reproductive/Obstetrics                          Anesthesia Physical Anesthesia Plan  ASA: III  Anesthesia Plan: MAC   Post-op Pain Management:    Induction: Intravenous  Airway Management Planned: Natural Airway  Additional Equipment:   Intra-op Plan:   Post-operative Plan:   Informed Consent: I have reviewed the patients History and Physical, chart, labs and discussed the procedure including the risks, benefits and alternatives for the proposed anesthesia with the patient or authorized representative who has indicated his/her understanding and acceptance.     Plan Discussed with: CRNA and Surgeon  Anesthesia Plan Comments: (Plan routine monitors, MAC)        Anesthesia Quick Evaluation

## 2013-03-25 NOTE — Preoperative (Signed)
Beta Blockers   Reason not to administer Beta Blockers:Not Applicable 

## 2013-03-25 NOTE — Op Note (Signed)
[facility] [address] [place], [id number]   ENDOSCOPY PROCEDURE REPORT  PATIENT: Ann [name], Ann [name]  MR#: [id number] BIRTHDATE: 06/21/1955 , 57  yrs. old GENDER: Female ENDOSCOPIST:Bobbi Kozakiewicz, MD REFERRED BY:  Family Practice Teaching Service PROCEDURE DATE:  03/25/2013 PROCEDURE:      Upper endoscopy ASA CLASS: INDICATIONS:   heme positive stool, iron deficiency anemia, aspirin and nonsteroidal anti-inflammatory drug exposure, previous history of ulcer disease MEDICATION:    MAC per anesthesia TOPICAL ANESTHETIC:    Cetacaine spray  DESCRIPTION OF PROCEDURE:   the procedure had been discussed with the patient and she provided written consent and was brought from her hospital room [facility] where a time out was performed and she received MAC anesthesia without clinical instability.  The Pentax adult video endoscope was passed under direct vision. The larynx looked grossly normal.  The esophagus was entered under direct vision and was normal in its entirety, without evidence of reflux esophagitis, Barrett's esophagus, varices, infection, or neoplasia. No ring, stricture, or hiatal hernia was appreciated.  the stomach contained no significant residual and was normal throughout. Despite her history of aspirin and nonsteroidals, there was no antral gastritis, nor any focal lesions such as erosions, ulcers, polyps, or masses, including a retroflexed view of the cardia and fundus.  The pylorus, duodenal bulb, and second duodenum looked normal.  The scope was removed from the patient. No biopsies were obtained. She tolerated the procedure well.     COMPLICATIONS: None  ENDOSCOPIC IMPRESSION: normal endoscopy. No source of heme positive stool or iron deficiency anemia identified  RECOMMENDATIONS: proceed colonoscopic evaluation    _______________________________ eSignedBernette Redbird:  Sathvika Ojo, MD 03/25/2013 10:13  AM    PATIENT NAME:  Ann [name], Ann [name] MR#: [id number]

## 2013-03-25 NOTE — Anesthesia Postprocedure Evaluation (Signed)
  Anesthesia Post-op Note  Patient: Ann ErbLinda [name]  Procedure(s) Performed: Procedure(s): COLONOSCOPY (N/A) ESOPHAGOGASTRODUODENOSCOPY (EGD) (N/A)  Patient Location: Endoscopy Unit  Anesthesia Type:MAC  Level of Consciousness: awake, alert , oriented and patient cooperative  Airway and Oxygen Therapy: Patient Spontanous Breathing  Post-op Pain: none  Post-op Assessment: Post-op Vital signs reviewed, Patient's Cardiovascular Status Stable, Respiratory Function Stable, Patent Airway, No signs of Nausea or vomiting and Pain level controlled  Post-op Vital Signs: Reviewed and stable  Complications: No apparent anesthesia complications

## 2013-03-25 NOTE — Progress Notes (Signed)
I co-sign Ann [name] assessments, notes, and documentations

## 2013-03-25 NOTE — Interval H&P Note (Signed)
History and Physical Interval Note:  03/25/2013 9:06 AM  Ann [name]  has presented today for surgery, with the diagnosis of Heme positive stool and anemia  The various methods of treatment have been discussed with the patient. After consideration of risks, benefits and other options for treatment, the patient has consented to  Procedure(s): COLONOSCOPY (N/A) ESOPHAGOGASTRODUODENOSCOPY (EGD) (N/A) as a surgical intervention .  The patient's history has been reviewed, patient examined, no change in status, stable for surgery.  I have reviewed the patient's chart and labs.  Questions were answered to the patient's satisfaction.     Florencia ReasonsBUCCINI,Steffani Dionisio V

## 2013-03-25 NOTE — Progress Notes (Signed)
Family Medicine Teaching Service Daily Progress Note Intern Pager: [phone]  Patient name: Ann ErbLinda [name] Medical record number: [id number] Date of birth: [birth year] Age: 58 y.o. Gender: female  Primary Care Provider: Pcp Not In System Consultants: Neurology Code Status: Full (confirmed on admission)  Pt Overview and Major Events to Date:  2/8 - admitted, Hgb 8.8-->8.6, FOBT (positive), started CTX / Azithro 2/9 - Hgb 8.4-->7.9, ordered iron studies, DC'd Azithro, c/s Neuro (ordered MRI Brain), c/s GI 2/10 - EEG (normal), MRI (no acute findings), GI recommend EGD / Colonoscopy on 2/11 2/11 - stable Hgb 8.6-->8.6, f/u GI endoscopy results --> possible discharge today  Assessment and Plan: Ann ErbLinda Doolan is a 58 y.o. female presenting with pre-syncopal vs syncopal episode with possible seizure-like activity, complains of worsened productive cough x 1 week and chest pain . PMH is significant for COPD (2/2 tobacco abuse), GERD, anemia, CVA, anxiety, chronic pain syndrome, chest surgery for cardiac tumor removal. (HX of CAD with CABG documented in Hx section but she denies this)  # Symptomatic Anemia, secondary to suspected UGI bleed, in setting of h/o GERD- likely chronic/subacute  - Improved Presents with decreased Hgb to 8.8 on admission with FOBT (positive), reports of melena without BRBPR or hematemesis, no overt clinical evidence of bleeding. Previous Hgb baseline ~ 12 (2014). Unclear if h/o PUD, however significant h/o frequent NSAID use, and previously treated with supplemental iron for anemia in past. - telemetry, closely monitor VS for any acute changes that suggest volume loss or persistent bleeding  - stabilized Hgb trend 8.1>>8.5>>8.6>>8.6 - monitor CBC daily - iron studies c/w chronic iron deficiency anemia, more likely from acute on chronic loss but consider alternative options such as hemolysis      - Haptoglobin (177 nml)      - Ferritin 10 (very low end of normal)      - Iron 27  (low), Sat ratio (7%, low), TIBC 382 (high normal) - Protonix IV 40mg  BID - if Hgb acutely worsening or Hgb <7.0 consider transfusion threshold, given pt is currently symptomatic - Patient reports h/o prior Colonoscopy (5 years ago - told had 2 polyps, removed), EGD (unknown results) - c/s GI ([facility]) re: eval of GI bleed - greatly appreciate all recommendations      - Recommend EGD / Colonoscopy today 2/11, discussed potential concern in setting of PNA, however clinically resp status significantly improved. GI will discuss with anesthesia to evaluate patient prior to pre-op.      - completed bowel prep, NPO for studies      - EGD and Colonoscopy - negative for acute etiology of possible GI bleed. Recommended continued outpatient work-up of anemia to follow Hgb trend, check FOBT, and monitor response to Iron PO supplement, if not improving then recommend referral to GI for further work-up with Capsule Endoscopy.  # Presumed LLL CAP - Improved Clinical history/exam supportive of CAP with worsening productive cough, subjective h/o fevers/chills, also chest pain. CXR with evidence of L-basilar infiltrate (compared to prior 12/11/12). WBC stable range 4.8 to 6.2 - Clinically improved respiratory status - remains afebrile - droplet precautions  - influenza panel (negative)  - continue antibiotic coverage for CAP on (2/8>>):      - DC'd CTX (2/8>>2/11) - completed course      - (2/9) DC'd Azithro PO - given concern of prolonged QT syndrome  # Chest pain, ruled out MI - Resolved Clinically CP appears atypical in description, however not reproducible on exam. Unclear history with prior  documentation of CAD and s/p CABG, however patient denies having an MI. Note patient does have evidence of thoracotomy, and charted h/o chest surgery for heart tumor removal. Significant risk factors (+smoker, >50 yrs, HLD).  - Hold ASA, previously continued ASA 81mg  until MI ruled out, and then hold d/t current GI bleed.  Believe that benefit in setting of potential acute chest pain outweighs inc bleeding risk. - Troponin-I (negative x 3) - Repeat EKG PRN symptoms - h/o prolonged QTc - risk stratification labs: HgbA1c (5.8), TSH (2.341), lipid panel (TC 170, TG 126, HDL 62, LDL 83)  # Seizure vs Syncope - Stable  Unclear etiology given history of episode this AM. No reported LOC or prior aura-like symptoms, suspect more likely pre-syncopal episode. Concern for seizure-like activity related to episode with b/l ext shaking resolved < 1 min, increased likelihood for potential seizure with described post-ictal phase following episode.  Differential includes cardiogenic vs neurologic etiology of episode, including CVA, dysrhythmia (concern with identified prolonged QT on EKG). Head CT negative for acute infarct vs bleed - seizure precautions - Orthostatic VS - Negative (100/52 --> 91/52 --> 99/44, with stable pulse) - 2D ECHO (EF 55-60%, mild MR, calcified linear density in RA, consider cardiac CTA? - no defect or PFO) - Evidence of QT prolongation with QTc 513 on EKG - Hold amitriptyline - Mag 2.0 / K 3.9 - no inc risk for dssarrhythmia - c/s Neurology re: potential seizure vs pre-syncope/syncope - greatly appreciate all recommendations    - MRI Brain w/o contrast (no acute findings)    - EEG (normal, without evidence of seizure)    - Neurology signed off, no further concerns, suspect likely syncopal episode, continue with current management  # H/o COPD  Suspect that respiratory symptoms are more related to focal findings of CAP plus febrile, possible COPD exacerbation with inc cough / sputum production.  - Resp status significantly improved, no evidence of clinical COPD exacerbation - Duonebs q 4 PRN / albuterol q 2 PRN  # Tobacco Abuse, chronic  - H/o 1ppd x 40 years  - Nicotine Patch  # HLD  - Continue home atorvastatin 40mg  daily  # Chronic Pain Syndrome  - Continue Norco 5-325mg  q 4 hr PRN  # Psych:  Anxiety, Insomnia  - Continue Xanax 1mg  TID PRN anxiety  - Hold Amitriptyline given QT prolong risk  - ordered Ambien 5mg  nightly PRN  FEN/GI: KVO, regular diet  Prophylaxis: SCDs (no pharm VTE prophylaxis in setting of likely GI bleed)  Disposition: Admit to FPTS, inpatient status for close monitoring and further diagnostic work-up, pending clinical improvement, expect patient to be discharged to home today, negative endoscopy work-up arrange close follow-up  Subjective: No acute events overnight. This morning she reports no concerns, she feels good and is just waiting for the testing to be completed this morning. Tolerated the bowel prep well yesterday / last night. Understands plan for potential discharge today after completion of tests, and discussed follow-up (PCP Dr. Adriana Simas in [place]) however she will be in [place] for 3 years, needs to establish with new PCP.  Denies fever/chills, SOB, CP, HA, any new neurological complaints  Objective: Temp:  [97.4 F (36.3 C)-97.7 F (36.5 C)] 97.7 F (36.5 C) (02/11 0456) Pulse Rate:  [78-82] 78 (02/11 0456) Resp:  [18-20] 18 (02/11 0456) BP: (109-117)/(59-78) 109/59 mmHg (02/11 0456) SpO2:  [100 %] 100 % (02/11 0456) Weight:  [172 lb (78.019 kg)] 172 lb (78.019 kg) (02/11 0233) Physical Exam:  General: pleasant and comfortable, laying in bed awake, NAD Cardiovascular: RRR, no murmurs heard  Respiratory: CTAB significantly improved / minimal LLL crackles. Otherwise clear without wheezing or rhonchi. Good air movement b/l with normal WOB. Abdomen: soft, NTND, +active BS  Extremities: non-tender, no edema, moves all ext, +2 peripheral pulses intact  Skin: warm, dry, no rashes  Neuro: awake, alert, oriented  Laboratory:  Recent Labs Lab 03/24/13 1930 03/25/13 0055 03/25/13 0110  WBC 4.9 4.7 4.8  HGB 8.5* 8.6* 8.6*  HCT 27.3* 27.5* 27.5*  PLT 192 182 174    Recent Labs Lab 03/22/13 1215 03/23/13 0054 03/24/13 0053 03/25/13 0055  NA  141 140 143 143  K 4.5 3.9 4.5 4.2  CL 104 105 107 105  CO2 24 23 25 26   BUN 14 18 15 8   CREATININE 0.98 1.27* 0.97 0.77  CALCIUM 8.9 8.5 8.8 8.6  PROT 6.7  --   --   --   BILITOT <0.2*  --   --   --   ALKPHOS 93  --   --   --   ALT 26  --   --   --   AST 31  --   --   --   GLUCOSE 96 96 83 87   UTox - THC (positive), all other negative  FOBT - Positive  Troponin-I - negative x 3 Influenza (negative) Iron/TIBC/Ferritin    Component Value Date/Time   IRON 27* 03/23/2013 1155   TIBC 382 03/23/2013 1155   FERRITIN 10 03/23/2013 1155   Haptoglobin 177  Imaging/Diagnostic Tests:  2/8 EKG  NSR, HR 95, normal axis, prolonged QT with QTC 513, no acute ST-T wave findings  2/8 Head CT w/o contrast  IMPRESSION:  Patchy periventricular small vessel disease. Asymmetric decreased  attenuation in the posterior mid right cerebellum compare of the  left is stable and may represent prior infarct in this area. No  acute appearing infarct is seen on this study. No hemorrhage or mass  Effect.  2/8 CXR Portable 1v  IMPRESSION:  Left base infiltrate.  Saralyn Pilar, DO  03/22/2013, 4:30 PM  PGY-1, [facility] Family Medicine  FPTS Intern pager: [phone], text pages welcome  2/9 2D ECHO Study Conclusions - Left ventricle: The cavity size was normal. Systolic function was normal. The estimated ejection fraction was in the range of 55% to 60%. Wall motion was normal; there were no regional wall motion abnormalities. - Mitral valve: Mild regurgitation. - Right atrium: There is a calcified linear density seen within the RA which has flow within it by colorflow doppler. This was seen in prior study 10/2012 but is more prominent now and only seen in the parasternal short axis veiw. Recommend Cardiac CTA for further evaluation - Atrial septum: No defect or patent foramen ovale was identified.  2/9 MRI Brain w/o contrast IMPRESSION:  No acute infarct.  No evidence of mesial temporal  sclerosis.  Prominence of the horizontal fissure greater on the right. Remote  right cerebellar infarct may have contributed to this appearance.  Minimal change in appearance of moderate number of punctate and  patchy nonspecific white matter type changes. This may reflect  result of small vessel disease in this hypertensive, smoker with  heart disease. Other causes of white matter type changes as noted  above.  On the gradient sequence, tiny area of blood breakdown products  versus prominent vessel within sulci right frontal lobe and left  occipital lobe (series 9, image 11). Tiny  cavernomas not excluded.  Appearance without change.  No intracranial enhancing lesion.   Saralyn Pilar, DO 03/25/2013, 8:18 AM PGY-1, [facility] FPTS Intern pager: [phone], text pages welcome

## 2013-03-25 NOTE — Progress Notes (Signed)
Endoscopy and colonoscopy were well tolerated this morning, and I have discussed the findings and recommendations with the family practice resident.  Please see dictated procedure report for recommendations.  I have put the patient on a heart healthy diet, and have switched her from intravenous to oral Protonix. (She may resume her Nexium, which she was on as an outpatient, when she goes home.)  I will sign off at this time, but would well can recall if I can be of further assistance in this patient's management.  Florencia Reasonsobert V. Turki Tapanes, M.D. [phone]

## 2013-03-25 NOTE — Progress Notes (Signed)
Pt d/c'd.  Educated on Anemia, GI bleed, and smoking cessation. Pt confirmed understanding.

## 2013-03-25 NOTE — Progress Notes (Signed)
I cosign with Laura B Caldwell's assessments, medication administration, notes, intake and output, and documentation for this shift. Kerry Chisolm A, RN    

## 2013-03-25 NOTE — Discharge Instructions (Signed)
You were hospitalized after the event that occurred on Sunday morning, after all of our testing we were unable to determine the specific cause of this event. However, we suspected that your low blood count and recent illness (pneumonia) played a role in making you weak and triggering the event.  We do not believe that you had any seizure activity, Neurology has completed their testing (MRI, EEG) and are not convinced it was a seizure.  For the Pneumonia that we discovered on Chest Xray, you have significantly improved on antibiotic therapy, and we will complete your course of antibiotics.  For your low blood count, the GI doctor has performed both the Upper Endoscopy and Colonoscopy to determine the source, however neither of these tests showed any significant findings that would be the source of your bleed. - We recommend close follow-up to monitor your blood counts to determine if it is improving or not - On discharge you will be given new rx fo Iron Supplement (recommend to take 3 times daily if tolerated) - If your blood count continues to worsen, and when your stool is checked for blood next time, then the recommendation would be to refer you back to GI (Dr. Matthias Hughs) for further work-up including a Capsule Endoscopy  Iron Deficiency Anemia, Adult Anemia is a condition in which there are less red blood cells or hemoglobin in the blood than normal. Hemoglobin is this part of red blood cells that carries oxygen. Iron deficiency anemia is anemia caused by too little iron. It is the most common type of anemia. It may leave you tired and short of breath. CAUSES   Lack of iron in the diet.  Poor absorption of iron, as seen with intestinal disorders.  Intestinal bleeding.  Heavy periods. SIGNS AND SYMPTOMS  Mild anemia may not be noticeable. Symptoms may include:  Fatigue.  Headache.  Pale skin.  Weakness.  Tiredness.  Shortness of breath.  Dizziness.  Cold hands and feet.  Fast  or irregular heartbeat. DIAGNOSIS  Diagnosis requires a thorough evaluation and physical exam by your health care provider. Blood tests are generally used to confirm iron deficiency anemia. Additional tests may be done to find the underlying cause of your anemia. These may include:  Testing for blood in the stool (fecal occult blood test).  A procedure to see inside the colon and rectum (colonoscopy).  A procedure to see inside the esophagus and stomach (endoscopy). TREATMENT  Iron deficiency anemia is treated by correcting the cause of the deficiency. Treatment may involve:  Adding iron-rich foods to your diet.  Taking iron supplements. Pregnant or breastfeeding women need to take extra iron, because their normal diet usually does not provide the required amount.  Taking vitamins. Vitamin C improves the absorption of iron. Your health care provider may recommend taking your iron tablets with a glass of orange juice or vitamin C supplement.  Medicines to make heavy menstrual flow lighter.  Surgery. HOME CARE INSTRUCTIONS   Take iron as directed by your health care provider.  If you cannot tolerate taking iron supplements by mouth, talk to your health care provider about taking them through a vein (intravenously) or an injection into a muscle.  For the best iron absorption, iron supplements should be taken on an empty stomach. If you cannot tolerate them on an empty stomach, you may need to take them with food.  Do not drink milk or take antacids at the same time as your iron supplements. Milk and antacids may  interfere with the absorption of iron.  Iron supplements can cause constipation. Make sure to include fiber in your diet to prevent constipation. A stool softener may also be recommended.  Take vitamins as directed by your health care provider.  Eat a diet rich in iron. Foods high in iron include liver, lean beef, whole-grain bread, eggs, dried fruit, and dark green, leafy  vegetables. SEEK IMMEDIATE MEDICAL CARE IF:   You faint. If this happens, do not drive. Call your local emergency services (911 in [place] if no other help is available.  You have chest pain.  You feel nauseous or vomit.  You have severe or increased shortness of breath with activity.  You feel weak.  You have a rapid heartbeat.  You have unexplained sweating.  You become lightheaded when getting up from a chair or bed. MAKE SURE YOU:   Understand these instructions.  Will watch your condition.  Will get help right away if you are not doing well or get worse. Document Released: 01/27/2000 Document Revised: 11/19/2012 Document Reviewed: 10/06/2012 [facility] Patient Information 2014 [place], [place].   Fecal Occult Blood Test This is a test done on a stool specimen to screen for gastrointestinal bleeding, which may be an indicator of colon cancer Is is usually done as part of a routine examination, annually, after age 250 or as directed by your caregiver. The fecal occult blood test (FOBT) checks for blood in your stool. Normally, there will not be enough blood lost through the gastrointestinal tract to turn an FOBT positive or for you to notice it visually in the form of bloody or dark, tarry stools. Any significant amount of blood being passed should be investigated.  A positive FOBT will tell your caregiver that you have bleeding occurring somewhere in your gastrointestinal tract. This blood loss could be due to ulcers, diverticulosis, bleeding polyps, inflammatory bowel disease, hemorrhoids, from swallowed blood due to bleeding gums or nosebleeds, or it could be due to benign or cancerous tumors. Anything that protrudes into the lumen (the empty space in the intestine), like a polyp or tumor, and is rubbed against by the fecal waste as it passes through has the potential to eventually bleed intermittently. Often this small amount of blood is the first, and sometimes the only, symptom of  early colon cancer, making the FOBT a valuable screening tool. PREPARATION FOR TEST  You should not eat red meat within three days before testing. Other substances that could cause a false positive test result include fish, turnips, horseradish, and drugs such as colchicines and oxidizing drugs (for example, iodine and boric acid). Be sure to carefully follow your caregiver's instructions. With FOBT, your caregiver or laboratory will give you one or more test "cards." You collect a separate sample from three different stools, usually on consecutive days. Each stool sample should be collected into a clean container and should not be contaminated with urine or water. The slide is labeled with your name and the date; then, with an applicator stick, you apply a thin smear of stool onto each filter paper square/window contained on the card. Allow the filter paper to dry. Once it is dry, it is stable. Usually you will collect all of the consecutive samples, and then return all of them to your caregiver or laboratory at the same time, sometimes by mailing them. There are also over the counter tests which are dropped in your toilet. NORMAL FINDINGS   No occult blood within the stool.  The FOBT test  is normally negative. A positive indicates either blood in the stool or an interfering substance. Multiple samples are done to: 1) catch intermittent bleeding; and 2) help rule out false positives. Ranges for normal findings may vary among different laboratories and hospitals. You should always check with your doctor after having lab work or other tests done to discuss the meaning of your test results and whether your values are considered within normal limits. MEANING OF TEST  Your caregiver will go over the test results with you and discuss the importance and meaning of your results, as well as treatment options and the need for additional tests if necessary. OBTAINING THE TEST RESULTS  It is your responsibility to  obtain your test results. Ask the lab or department performing the test when and how you will get your results. Document Released: 02/24/2004 Document Revised: 04/23/2011 Document Reviewed: 01/09/2008 [facility] Patient Information 2014 [place], [place].

## 2013-03-25 NOTE — Progress Notes (Signed)
Family Medicine Teaching Service Attending Note  I interviewed and examined patient Ann [name] and reviewed their tests and x-rays.  I discussed with Dr. Kirtland BouchardK and reviewed their note for today.  I agree with their assessment and plan.     Additionally  Feeling well Appreciate endo results Ok to discharge with follow up for anemia

## 2013-03-25 NOTE — Transfer of Care (Signed)
Immediate Anesthesia Transfer of Care Note  Patient: Ann ErbLinda [name]  Procedure(s) Performed: Procedure(s): COLONOSCOPY (N/A) ESOPHAGOGASTRODUODENOSCOPY (EGD) (N/A)  Patient Location: Endoscopy Unit  Anesthesia Type:MAC  Level of Consciousness: awake, alert  and oriented  Airway & Oxygen Therapy: Patient Spontanous Breathing and Patient connected to nasal cannula oxygen  Post-op Assessment: Report given to PACU RN, Post -op Vital signs reviewed and stable, Patient moving all extremities and Patient moving all extremities X 4  Post vital signs: Reviewed and stable  Complications: No apparent anesthesia complications

## 2013-03-26 ENCOUNTER — Encounter (HOSPITAL_COMMUNITY): Payer: Self-pay | Admitting: Gastroenterology

## 2013-06-26 ENCOUNTER — Emergency Department (HOSPITAL_COMMUNITY)
Admission: EM | Admit: 2013-06-26 | Discharge: 2013-06-27 | Disposition: A | Discharge status: Home / Self Care | Payer: Medicare HMO | Attending: Emergency Medicine | Admitting: Emergency Medicine

## 2013-06-26 ENCOUNTER — Encounter (HOSPITAL_COMMUNITY): Payer: Self-pay | Admitting: Emergency Medicine

## 2013-06-26 DIAGNOSIS — F17200 Nicotine dependence, unspecified, uncomplicated: Secondary | ICD-10-CM | POA: Insufficient documentation

## 2013-06-26 DIAGNOSIS — Z951 Presence of aortocoronary bypass graft: Secondary | ICD-10-CM | POA: Insufficient documentation

## 2013-06-26 DIAGNOSIS — Z87448 Personal history of other diseases of urinary system: Secondary | ICD-10-CM | POA: Insufficient documentation

## 2013-06-26 DIAGNOSIS — J449 Chronic obstructive pulmonary disease, unspecified: Secondary | ICD-10-CM | POA: Insufficient documentation

## 2013-06-26 DIAGNOSIS — I2510 Atherosclerotic heart disease of native coronary artery without angina pectoris: Secondary | ICD-10-CM | POA: Insufficient documentation

## 2013-06-26 DIAGNOSIS — Z7982 Long term (current) use of aspirin: Secondary | ICD-10-CM | POA: Insufficient documentation

## 2013-06-26 DIAGNOSIS — M5380 Other specified dorsopathies, site unspecified: Secondary | ICD-10-CM | POA: Insufficient documentation

## 2013-06-26 DIAGNOSIS — M62830 Muscle spasm of back: Secondary | ICD-10-CM

## 2013-06-26 DIAGNOSIS — Z79899 Other long term (current) drug therapy: Secondary | ICD-10-CM | POA: Insufficient documentation

## 2013-06-26 DIAGNOSIS — J4489 Other specified chronic obstructive pulmonary disease: Secondary | ICD-10-CM | POA: Insufficient documentation

## 2013-06-26 MED ORDER — DIAZEPAM 5 MG/ML IJ SOLN
5.0000 mg | Freq: Once | INTRAMUSCULAR | Status: AC
  Administered 2013-06-27: 5 mg via INTRAVENOUS
  Filled 2013-06-26: qty 2

## 2013-06-26 MED ORDER — HYDROMORPHONE HCL PF 1 MG/ML IJ SOLN
0.5000 mg | Freq: Once | INTRAMUSCULAR | Status: AC
  Administered 2013-06-27: 0.5 mg via INTRAVENOUS
  Filled 2013-06-26: qty 1

## 2013-06-26 NOTE — ED Notes (Signed)
EKG given to EDP Pickering for review 

## 2013-06-26 NOTE — ED Notes (Signed)
Pt arrived to the ED with a complaint of chest pain that starts in her back and settles in her central chest.  Pt states the pain is a sharp stabbing pain.  Pt has had the pain for three days.  Pt took eight (8) xanax today without any relief of the pain.

## 2013-06-26 NOTE — ED Provider Notes (Addendum)
CSN: [id number]     Arrival date & time 06/26/13  2320 History   First MD Initiated Contact with Patient 06/26/13 2344     Chief Complaint  Patient presents with  . Chest Pain     (Consider location/radiation/quality/duration/timing/severity/associated sxs/prior Treatment) HPI Comments: Patient reports having stabbing intermittent back pain radiating to L chest wall intermittently for 3 days Has been taking Xanax for symptoms without relief.   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  Patient is a 58 y.o. female presenting with chest pain. The history is provided by the patient.  Chest Pain Pain location:  L chest Pain quality: aching, shooting and stabbing   Pain radiates to the back: yes   Pain severity:  Moderate Onset quality:  Sudden Timing:  Intermittent Progression:  Waxing and waning Chronicity:  Recurrent Context: at rest   Context: not breathing, not lifting, no movement and not raising an arm   Relieved by:  Nothing Worsened by:  Nothing tried Ineffective treatments: xanax. Associated symptoms: back pain   Associated symptoms: no cough, no near-syncope and no shortness of breath   Risk factors: smoking     Past Medical History  Diagnosis Date  . Coronary artery disease   . Pyelonephritis   . Pyelonephritis 1  . COPD (chronic obstructive pulmonary disease)    Past Surgical History  Procedure Laterality Date  . Abdominal hysterectomy    . Coronary artery bypass graft  1  . Colonoscopy N/A 03/25/2013    Procedure: COLONOSCOPY;  Surgeon: Florencia Reasonsobert V Buccini, MD;  [facility];  Service: Endoscopy;  Laterality: N/A;  . Esophagogastroduodenoscopy N/A 03/25/2013    Procedure: ESOPHAGOGASTRODUODENOSCOPY (EGD);  Surgeon: Florencia Reasonsobert V Buccini, MD;  [facility];  Service: Endoscopy;  Laterality: N/A;   Family History  Problem Relation Age of Onset  . Hypertension Mother   . Hypertension Father    History  Substance Use Topics  . Smoking status: Current Every Day Smoker -- 0.50 packs/day for 40 years    Types: Cigarettes  . Smokeless tobacco: Not on file  . Alcohol Use: No   OB History   Grav Para Term Preterm Abortions TAB SAB Ect Mult Living                 Review of Systems  Respiratory: Negative for cough and shortness of breath.   Cardiovascular: Positive for chest pain. Negative for near-syncope.  Musculoskeletal: Positive for back pain.      Allergies  Codeine; Morphine and related; and Aleve  Home Medications   Prior to Admission medications   Medication Sig Start Date End Date Taking? Authorizing Provider  albuterol (PROVENTIL HFA;VENTOLIN HFA) 108 (90 BASE) MCG/ACT inhaler Inhale 2 puffs into the lungs every 6 (six) hours as needed for wheezing or shortness of breath.    Historical Provider, MD  ALPRAZolam Prudy Feeler(XANAX) 1 MG tablet Take 1 mg by mouth 3 (three) times daily as needed for anxiety.     Historical Provider, MD  amitriptyline (ELAVIL) 100 MG tablet Take 100 mg by mouth at bedtime.    Historical Provider, MD  aspirin EC 81 MG tablet Take 81 mg by mouth daily.    Historical Provider, MD  atorvastatin (LIPITOR) 40 MG tablet Take 40 mg  by mouth at bedtime.    Historical Provider, MD  esomeprazole (NEXIUM) 40 MG capsule Take 40 mg by mouth at bedtime.     Historical Provider, MD  ferrous sulfate 325 (65 FE) MG tablet Take 1 tablet (325 mg total) by mouth 3 (three) times daily with meals. 03/25/13   Saralyn PilarAlexander Karamalegos, DO  ondansetron (ZOFRAN) 4 MG tablet Take 4 mg by mouth every 8 (eight) hours as needed for nausea or vomiting.    Historical Provider, MD   BP 107/55  Pulse 97  Temp(Src) 98.5 F (36.9 C) (Oral)  Resp 16  Ht 5\' 4"  (1.626 m)  Wt 169 lb (76.658 kg)  BMI 28.99 kg/m2  SpO2 94% Physical Exam   Nursing note and vitals reviewed. Constitutional: She appears well-developed and well-nourished.  HENT:  Head: Normocephalic.  Eyes: Pupils are equal, round, and reactive to light.  Neck: Normal range of motion.  Cardiovascular: Normal rate and regular rhythm.   Pulmonary/Chest: Effort normal. She exhibits tenderness.  Abdominal: Soft.  Skin: Skin is warm.    ED Course  Procedures (including critical care time) Labs Review Labs Reviewed  CBC WITH DIFFERENTIAL - Abnormal; Notable for the following:    RBC 3.83 (*)    Hemoglobin 8.7 (*)    HCT 28.4 (*)    MCV 74.2 (*)    MCH 22.7 (*)    RDW 17.8 (*)    All other components within normal limits  I-STAT CHEM 8, ED - Abnormal; Notable for the following:    Hemoglobin 9.9 (*)    HCT 29.0 (*)    All other components within normal limits  TROPONIN I    Imaging Review Dg Chest 2 View  06/27/2013   CLINICAL DATA:  Chest pain, smoker.  EXAM: CHEST  2 VIEW  COMPARISON:  DG CHEST 1V PORT dated 03/22/2013  FINDINGS: The cardiac silhouette is upper limits of normal in size, mediastinal silhouette is nonsuspicious, status post median sternotomy for apparent coronary artery bypass grafting. Bibasilar strandy densities, similar without pleural effusions or focal consolidations. Increased lung volumes. Trachea projects midline and there is no pneumothorax.  Multiple EKG lines overlie the patient and may obscure subtle underlying pathology. Soft tissue planes and included osseous structures are nonsuspicious.  IMPRESSION: Borderline cardiomegaly and COPD with left greater than right bibasilar atelectasis.   Electronically Signed   By: Awilda Metroourtnay  Bloomer   On: 06/27/2013 01:27     EKG Interpretation   Date/Time:  Friday Jun 26 2013 23:32:25 EDT Ventricular Rate:  95 PR Interval:  100 QRS Duration: 90 QT Interval:  310 QTC Calculation: 390 R Axis:   78 Text Interpretation:  Sinus rhythm Short PR interval Nonspecific repol  abnormality,  diffuse leads Artifact in lead(s) I III aVR aVL aVF  Nonspecific ST and T wave abnormality Abnormal ECG Confirmed by OPITZ  MD,  BRIAN ([id number]) on 06/27/2013 12:00:37 AM      MDM  After Valium patient is no longer writhing in pain  LAbs reviewed  Troponin normal  patient has been comfortable with no further muscle spasm since medication  Final diagnoses:  Muscle spasm of back         Arman FilterGail K Paula Busenbark, NP 06/27/13 0500  Arman FilterGail K Val Farnam, NP 07/08/13 [phone]

## 2013-06-27 ENCOUNTER — Emergency Department (HOSPITAL_COMMUNITY): Payer: Medicare HMO

## 2013-06-27 LAB — CBC WITH DIFFERENTIAL/PLATELET
BASOS ABS: 0.1 10*3/uL (ref 0.0–0.1)
Basophils Relative: 1 % (ref 0–1)
Eosinophils Absolute: 0.2 10*3/uL (ref 0.0–0.7)
Eosinophils Relative: 3 % (ref 0–5)
HEMATOCRIT: 28.4 % — AB (ref 36.0–46.0)
HEMOGLOBIN: 8.7 g/dL — AB (ref 12.0–15.0)
LYMPHS PCT: 38 % (ref 12–46)
Lymphs Abs: 2.2 10*3/uL (ref 0.7–4.0)
MCH: 22.7 pg — ABNORMAL LOW (ref 26.0–34.0)
MCHC: 30.6 g/dL (ref 30.0–36.0)
MCV: 74.2 fL — ABNORMAL LOW (ref 78.0–100.0)
MONO ABS: 0.5 10*3/uL (ref 0.1–1.0)
MONOS PCT: 9 % (ref 3–12)
Neutro Abs: 2.7 10*3/uL (ref 1.7–7.7)
Neutrophils Relative %: 49 % (ref 43–77)
Platelets: 293 10*3/uL (ref 150–400)
RBC: 3.83 MIL/uL — AB (ref 3.87–5.11)
RDW: 17.8 % — ABNORMAL HIGH (ref 11.5–15.5)
WBC: 5.7 10*3/uL (ref 4.0–10.5)

## 2013-06-27 LAB — I-STAT CHEM 8, ED
BUN: 9 mg/dL (ref 6–23)
Calcium, Ion: 1.17 mmol/L (ref 1.13–1.30)
Chloride: 101 mEq/L (ref 96–112)
Creatinine, Ser: 0.9 mg/dL (ref 0.50–1.10)
Glucose, Bld: 87 mg/dL (ref 70–99)
HEMATOCRIT: 29 % — AB (ref 36.0–46.0)
HEMOGLOBIN: 9.9 g/dL — AB (ref 12.0–15.0)
Potassium: 3.9 mEq/L (ref 3.7–5.3)
Sodium: 137 mEq/L (ref 137–147)
TCO2: 24 mmol/L (ref 0–100)

## 2013-06-27 LAB — TROPONIN I

## 2013-06-27 MED ORDER — DIAZEPAM 5 MG PO TABS: 5.0000 mg | ORAL_TABLET | Freq: Four times a day (QID) | ORAL | Status: AC | PRN

## 2013-06-27 NOTE — Discharge Instructions (Signed)
Tonight your cardiac evaluation was normal You are being treated for muscle spasm in your back with Valium  Please make an appointment with your PCP when you get home for further evalaution   If you have a return of your chest pain that is associated with shortness of breath, nausea or become sweaty with the pain please go to the nearest ED for further evaluation

## 2013-06-27 NOTE — ED Notes (Signed)
Patient transported to X-ray 

## 2013-06-28 NOTE — ED Provider Notes (Signed)
Medical screening examination/treatment/procedure(s) were performed by non-physician practitioner and as supervising physician I was immediately available for consultation/collaboration.   EKG Interpretation   Date/Time:  Friday Jun 26 2013 23:32:25 EDT Ventricular Rate:  95 PR Interval:  100 QRS Duration: 90 QT Interval:  310 QTC Calculation: 390 R Axis:   78 Text Interpretation:  Sinus rhythm Short PR interval Nonspecific repol  abnormality, diffuse leads Artifact in lead(s) I III aVR aVL aVF  Nonspecific ST and T wave abnormality Abnormal ECG Confirmed by Rosary Filosa  MD,  Thimothy Barretta ([id number]) on 06/27/2013 12:00:37 AM       Ann NielsenBrian Mona Ayars, MD 06/28/13 [phone]

## 2013-06-29 ENCOUNTER — Encounter (HOSPITAL_COMMUNITY): Payer: Self-pay | Admitting: Emergency Medicine

## 2013-06-29 ENCOUNTER — Emergency Department (HOSPITAL_COMMUNITY)
Admission: EM | Admit: 2013-06-29 | Discharge: 2013-06-29 | Disposition: A | Discharge status: Home / Self Care | Payer: Medicare HMO | Attending: Emergency Medicine | Admitting: Emergency Medicine

## 2013-06-29 ENCOUNTER — Emergency Department (HOSPITAL_COMMUNITY): Payer: Medicare HMO

## 2013-06-29 DIAGNOSIS — Z7982 Long term (current) use of aspirin: Secondary | ICD-10-CM | POA: Diagnosis not present

## 2013-06-29 DIAGNOSIS — Z951 Presence of aortocoronary bypass graft: Secondary | ICD-10-CM | POA: Diagnosis not present

## 2013-06-29 DIAGNOSIS — R000 Tachycardia, unspecified: Secondary | ICD-10-CM | POA: Insufficient documentation

## 2013-06-29 DIAGNOSIS — D649 Anemia, unspecified: Secondary | ICD-10-CM | POA: Diagnosis not present

## 2013-06-29 DIAGNOSIS — J449 Chronic obstructive pulmonary disease, unspecified: Secondary | ICD-10-CM | POA: Diagnosis not present

## 2013-06-29 DIAGNOSIS — F17200 Nicotine dependence, unspecified, uncomplicated: Secondary | ICD-10-CM | POA: Diagnosis not present

## 2013-06-29 DIAGNOSIS — M549 Dorsalgia, unspecified: Secondary | ICD-10-CM

## 2013-06-29 DIAGNOSIS — J4489 Other specified chronic obstructive pulmonary disease: Secondary | ICD-10-CM | POA: Insufficient documentation

## 2013-06-29 DIAGNOSIS — M546 Pain in thoracic spine: Secondary | ICD-10-CM | POA: Diagnosis present

## 2013-06-29 DIAGNOSIS — Z87440 Personal history of urinary (tract) infections: Secondary | ICD-10-CM | POA: Diagnosis not present

## 2013-06-29 DIAGNOSIS — I2510 Atherosclerotic heart disease of native coronary artery without angina pectoris: Secondary | ICD-10-CM | POA: Insufficient documentation

## 2013-06-29 DIAGNOSIS — Z79899 Other long term (current) drug therapy: Secondary | ICD-10-CM | POA: Insufficient documentation

## 2013-06-29 LAB — I-STAT CHEM 8, ED
BUN: 5 mg/dL — ABNORMAL LOW (ref 6–23)
CALCIUM ION: 1.26 mmol/L (ref 1.13–1.30)
CREATININE: 1 mg/dL (ref 0.50–1.10)
Chloride: 106 mEq/L (ref 96–112)
Glucose, Bld: 103 mg/dL — ABNORMAL HIGH (ref 70–99)
HEMATOCRIT: 27 % — AB (ref 36.0–46.0)
HEMOGLOBIN: 9.2 g/dL — AB (ref 12.0–15.0)
Potassium: 3.6 mEq/L — ABNORMAL LOW (ref 3.7–5.3)
Sodium: 143 mEq/L (ref 137–147)
TCO2: 25 mmol/L (ref 0–100)

## 2013-06-29 LAB — I-STAT TROPONIN, ED: Troponin i, poc: 0 ng/mL (ref 0.00–0.08)

## 2013-06-29 MED ORDER — OXYCODONE-ACETAMINOPHEN 5-325 MG PO TABS: 1.0000 | ORAL_TABLET | ORAL | Status: AC | PRN

## 2013-06-29 MED ORDER — DIAZEPAM 5 MG PO TABS: 5.0000 mg | ORAL_TABLET | Freq: Four times a day (QID) | ORAL | Status: AC | PRN

## 2013-06-29 MED ORDER — HYDROMORPHONE HCL PF 1 MG/ML IJ SOLN
1.0000 mg | Freq: Once | INTRAMUSCULAR | Status: AC
  Administered 2013-06-29: 1 mg via INTRAVENOUS
  Filled 2013-06-29: qty 1

## 2013-06-29 MED ORDER — DIAZEPAM 5 MG/ML IJ SOLN
5.0000 mg | Freq: Once | INTRAMUSCULAR | Status: AC
  Administered 2013-06-29: 5 mg via INTRAVENOUS
  Filled 2013-06-29: qty 2

## 2013-06-29 NOTE — ED Provider Notes (Signed)
Medical screening examination/treatment/procedure(s) were performed by non-physician practitioner and as supervising physician I was immediately available for consultation/collaboration.   EKG Interpretation None        Richardean Canalavid H Yao, MD 06/29/13 [phone]

## 2013-06-29 NOTE — ED Provider Notes (Signed)
CSN: [id number]     Arrival date & time 06/29/13  1850 History   First MD Initiated Contact with Patient 06/29/13 2018     Chief Complaint  Patient presents with  . Back Pain     (Consider location/radiation/quality/duration/timing/severity/associated sxs/prior Treatment) HPI Comments: Patient is 58 year old female who presents to the ED with complaints of left sided thoracic back pain - she states she was seen here 3 days ago for the same - they did more of a cardiac type work up and did not find a cause for the pain but they thought this was muscle spasm - she states that she has been taking the valium at home for this without relief of the pain - she denies fever, chills, cough, congestion, shortness of breath, chest pain, nausea, vomiting or diarrhea.  Reports no known injury to her back and no radiation of the pain - she denies rash at this time as well.  Patient is a 58 y.o. female presenting with back pain. The history is provided by the patient. No language interpreter was used.  Back Pain Location:  Thoracic spine Quality:  Stabbing and cramping Radiates to:  Does not radiate Pain severity:  Severe Pain is:  Same all the time Onset quality:  Gradual Duration:  5 days Timing:  Constant Progression:  Worsening Chronicity:  New Context: not falling, not lifting heavy objects, not MCA, not MVA, not physical stress and not recent illness   Relieved by:  Nothing Worsened by:  Bending and movement Ineffective treatments:  None tried Associated symptoms: no abdominal pain, no bladder incontinence, no bowel incontinence, no chest pain, no fever, no numbness, no paresthesias, no tingling and no weakness     Past Medical History  Diagnosis Date  . Coronary artery disease   . Pyelonephritis   . Pyelonephritis 1  . COPD (chronic obstructive pulmonary disease)    Past Surgical History  Procedure Laterality Date  . Abdominal hysterectomy    . Coronary artery bypass graft  1  .  Colonoscopy N/A 03/25/2013    Procedure: COLONOSCOPY;  Surgeon: Florencia Reasons, MD;  Location: [facility] ENDOSCOPY;  Service: Endoscopy;  Laterality: N/A;  . Esophagogastroduodenoscopy N/A 03/25/2013    Procedure: ESOPHAGOGASTRODUODENOSCOPY (EGD);  Surgeon: Florencia Reasons, MD;  Location: [facility] ENDOSCOPY;  Service: Endoscopy;  Laterality: N/A;   Family History  Problem Relation Age of Onset  . Hypertension Mother   . Hypertension Father    History  Substance Use Topics  . Smoking status: Current Every Day Smoker -- 0.50 packs/day for 40 years    Types: Cigarettes  . Smokeless tobacco: Not on file  . Alcohol Use: No   OB History   Grav Para Term Preterm Abortions TAB SAB Ect Mult Living                 Review of Systems  Constitutional: Negative for fever.  Cardiovascular: Negative for chest pain.  Gastrointestinal: Negative for abdominal pain and bowel incontinence.  Genitourinary: Negative for bladder incontinence.  Musculoskeletal: Positive for back pain.  Neurological: Negative for tingling, weakness, numbness and paresthesias.  All other systems reviewed and are negative.     Allergies  Codeine; Morphine and related; and Aleve  Home Medications   Prior to Admission medications   Medication Sig Start Date End Date Taking? Authorizing Provider  acetaminophen (TYLENOL) 500 MG tablet Take 1,000 mg by mouth every 6 (six) hours as needed for moderate pain.   Yes Historical  Provider, MD  albuterol (PROVENTIL HFA;VENTOLIN HFA) 108 (90 BASE) MCG/ACT inhaler Inhale 2 puffs into the lungs every 6 (six) hours as needed for wheezing or shortness of breath.   Yes Historical Provider, MD  ALPRAZolam Prudy Feeler(XANAX) 1 MG tablet Take 1 mg by mouth 3 (three) times daily as needed for anxiety.    Yes Historical Provider, MD  aspirin EC 325 MG tablet Take 325 mg by mouth every morning.   Yes Historical Provider, MD  atorvastatin (LIPITOR) 40 MG tablet Take 40 mg by mouth at bedtime.   Yes Historical  Provider, MD  diazepam (VALIUM) 5 MG tablet Take 1 tablet (5 mg total) by mouth every 6 (six) hours as needed for muscle spasms. 06/27/13  Yes Arman FilterGail K Schulz, NP  esomeprazole (NEXIUM) 40 MG capsule Take 40 mg by mouth every morning.    Yes Historical Provider, MD  estradiol (ESTRACE) 1 MG tablet Take 1 mg by mouth every morning.   Yes Historical Provider, MD  ferrous sulfate 325 (65 FE) MG tablet Take 325 mg by mouth daily with breakfast.   Yes Historical Provider, MD  ondansetron (ZOFRAN) 4 MG tablet Take 4 mg by mouth every 8 (eight) hours as needed for nausea or vomiting.   Yes Historical Provider, MD   BP 144/74  Pulse 89  Temp(Src) 98.1 F (36.7 C) (Oral)  Resp 18  SpO2 95% Physical Exam  Nursing note and vitals reviewed. Constitutional: She is oriented to person, place, and time. She appears well-developed and well-nourished.  Writhing in bed  HENT:  Head: Normocephalic and atraumatic.  Right Ear: External ear normal.  Left Ear: External ear normal.  Nose: Nose normal.  Mouth/Throat: Oropharynx is clear and moist. No oropharyngeal exudate.  Eyes: Conjunctivae are normal. Pupils are equal, round, and reactive to light. No scleral icterus.  Neck: Normal range of motion. Neck supple.  Cardiovascular: Regular rhythm and normal heart sounds.  Exam reveals no gallop and no friction rub.   No murmur heard. tachycardia  Pulmonary/Chest: Effort normal and breath sounds normal. No respiratory distress. She has no wheezes. She has no rales. She exhibits no tenderness.  Abdominal: Soft. Bowel sounds are normal. She exhibits no distension. There is no tenderness. There is no rebound and no guarding.  Musculoskeletal:       Thoracic back: She exhibits tenderness, pain and spasm. She exhibits normal range of motion and no bony tenderness.       Lumbar back: She exhibits normal range of motion, no tenderness and no bony tenderness.       Back:  Lymphadenopathy:    She has no cervical  adenopathy.  Neurological: She is alert and oriented to person, place, and time. She has normal reflexes. She exhibits normal muscle tone. Coordination normal.  Skin: Skin is warm and dry. No rash noted. No erythema. No pallor.  Psychiatric: She has a normal mood and affect. Her behavior is normal. Judgment and thought content normal.    ED Course  Procedures (including critical care time) Labs Review Labs Reviewed - No data to display  Imaging Review Dg Thoracic Spine 2 View  06/29/2013   CLINICAL DATA:  Pain without trauma for 2 days. Remote history of fracture.  EXAM: THORACIC SPINE - 2 VIEW  COMPARISON:  06/27/2013 chest radiographs.  FINDINGS: Median sternotomy. Normal paraspinous contours. The lateral view images from approximately top T3 through the bottom of L1. Maintenance of vertebral body height and alignment across these levels. Intervertebral disc heights  are maintained.  Swimmer's view demonstrates maintained upper thoracic vertebral body height.  IMPRESSION: No acute osseous abnormality.   Electronically Signed   By: Jeronimo GreavesKyle  Talbot M.D.   On: 06/29/2013 21:36     EKG Interpretation None     Medications  diazepam (VALIUM) injection 5 mg (5 mg Intravenous Given 06/29/13 2051)  HYDROmorphone (DILAUDID) injection 1 mg (1 mg Intravenous Given 06/29/13 2053)    MDM   Thoracic back pain  Patient here with left sided back pain - markedly improved after the administration of pain control and muscle relaxers, heart rate was intially 114, now it is 86 and regular, I have thought about the possiblity of PE but do not suspect this because she has no chest pain or shortness of breath - I have re-checked her HGB and she remains anemic but without symptoms, and her troponin here was negative as well so I doubt this to be more cardiac in nature - plan to place her on pain control and continue muscle relaxers - [place]    Izola PriceFrances C. Marisue HumbleSanford,  PA-C 06/29/13 2329

## 2013-06-29 NOTE — ED Notes (Signed)
Pt states that she has had back pain x 5 days; pt states that she was seen here on 5/15; pt states that she was diagnosed with muscle spasms but that the medication prescribed prescribed isn't working

## 2013-06-29 NOTE — Discharge Instructions (Signed)
Back Pain, Adult Low back pain is very common. About 1 in 5 people have back pain.The cause of low back pain is rarely dangerous. The pain often gets better over time.About half of people with a sudden onset of back pain feel better in just 2 weeks. About 8 in 10 people feel better by 6 weeks.  CAUSES Some common causes of back pain include:  Strain of the muscles or ligaments supporting the spine.  Wear and tear (degeneration) of the spinal discs.  Arthritis.  Direct injury to the back. DIAGNOSIS Most of the time, the direct cause of low back pain is not known.However, back pain can be treated effectively even when the exact cause of the pain is unknown.Answering your caregiver's questions about your overall health and symptoms is one of the most accurate ways to make sure the cause of your pain is not dangerous. If your caregiver needs more information, he or she may order lab work or imaging tests (X-rays or MRIs).However, even if imaging tests show changes in your back, this usually does not require surgery. HOME CARE INSTRUCTIONS For many people, back pain returns.Since low back pain is rarely dangerous, it is often a condition that people can learn to manageon their own.   Remain active. It is stressful on the back to sit or stand in one place. Do not sit, drive, or stand in one place for more than 30 minutes at a time. Take short walks on level surfaces as soon as pain allows.Try to increase the length of time you walk each day.  Do not stay in bed.Resting more than 1 or 2 days can delay your recovery.  Do not avoid exercise or work.Your body is made to move.It is not dangerous to be active, even though your back may hurt.Your back will likely heal faster if you return to being active before your pain is gone.  Pay attention to your body when you bend and lift. Many people have less discomfortwhen lifting if they bend their knees, keep the load close to their bodies,and  avoid twisting. Often, the most comfortable positions are those that put less stress on your recovering back.  Find a comfortable position to sleep. Use a firm mattress and lie on your side with your knees slightly bent. If you lie on your back, put a pillow under your knees.  Only take over-the-counter or prescription medicines as directed by your caregiver. Over-the-counter medicines to reduce pain and inflammation are often the most helpful.Your caregiver may prescribe muscle relaxant drugs.These medicines help dull your pain so you can more quickly return to your normal activities and healthy exercise.  Put ice on the injured area.  Put ice in a plastic bag.  Place a towel between your skin and the bag.  Leave the ice on for 15-20 minutes, 03-04 times a day for the first 2 to 3 days. After that, ice and heat may be alternated to reduce pain and spasms.  Ask your caregiver about trying back exercises and gentle massage. This may be of some benefit.  Avoid feeling anxious or stressed.Stress increases muscle tension and can worsen back pain.It is important to recognize when you are anxious or stressed and learn ways to manage it.Exercise is a great option. SEEK MEDICAL CARE IF:  You have pain that is not relieved with rest or medicine.  You have pain that does not improve in 1 week.  You have new symptoms.  You are generally not feeling well. SEEK   IMMEDIATE MEDICAL CARE IF:   You have pain that radiates from your back into your legs.  You develop new bowel or bladder control problems.  You have unusual weakness or numbness in your arms or legs.  You develop nausea or vomiting.  You develop abdominal pain.  You feel faint. Document Released: 01/29/2005 Document Revised: 07/31/2011 Document Reviewed: 06/19/2010 [employer] Patient Information 2014 [employer].  

## 2013-07-14 NOTE — ED Provider Notes (Signed)
Medical screening examination/treatment/procedure(s) were performed by non-physician practitioner and as supervising physician I was immediately available for consultation/collaboration.   EKG Interpretation   Date/Time:  Friday Jun 26 2013 23:32:25 EDT Ventricular Rate:  95 PR Interval:  100 QRS Duration: 90 QT Interval:  310 QTC Calculation: 390 R Axis:   78 Text Interpretation:  Sinus rhythm Short PR interval Nonspecific repol  abnormality, diffuse leads Artifact in lead(s) I III aVR aVL aVF  Nonspecific ST and T wave abnormality Abnormal ECG Confirmed by Mykelti Goldenstein  MD,  Kaelie Henigan ([id number]) on 06/27/2013 12:00:37 AM       Sunnie Nielsen, MD 07/14/13 2119

## 2023-02-25 IMAGING — CT CT CHEST WITHOUT CONTRAST
3 of 4 series · 17 of 30 positions shown, 19 images · non-contrast
Comparison: 10/11/2010

Abn xray
Addendum:
(#[id number].Clo
\F\[HOSPITAL]\F\
Communicated to: Jack, Kenji via [HOSPITAL]
On behalf of: Dr. Amairany Weis
By: Ninikona Andriadze
At: [DATE]
On: 02/25/2023 EST
\F\/[HOSPITAL]\F\#)
FINAL REPORT:
INDICATION: Cough, persistent
Exam: CT of the chest without contrast
TECHNIQUE: Multiplanar images were reconstructed through the chest.
All CT scans at this facility use iterative reconstruction technique, dose modulation and/or weight based dosing when appropriate to reduce radiation dose to as low as reasonably achievable.

[Series 4: thins · axial · 0.50mm/px · z∈[-151,-38]mm · 7 of 128 slices shown, 9 images]
[im 19/128  mediastinal]
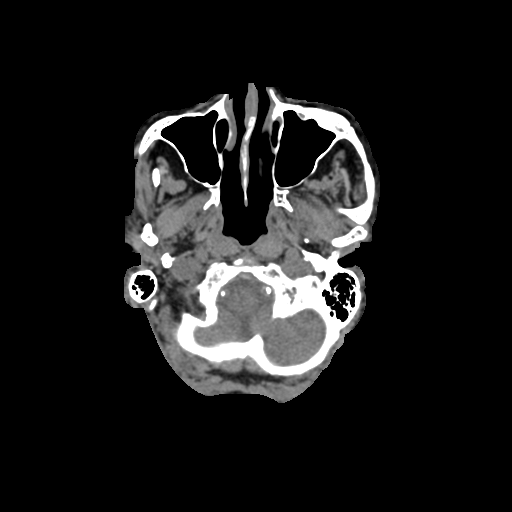
[im 19/128  lung]
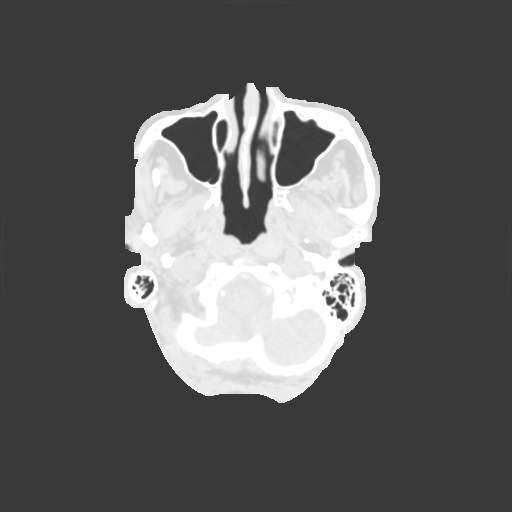
[im 37/128  lung]
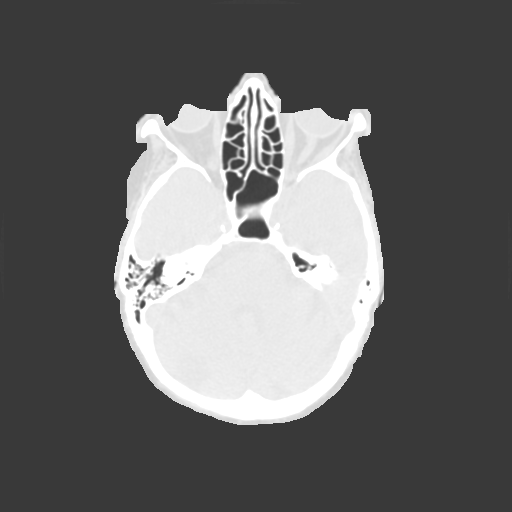
[im 55/128  lung]
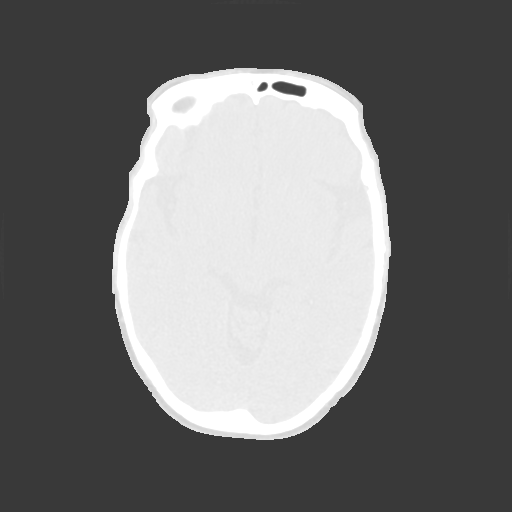
[im 73/128  lung]
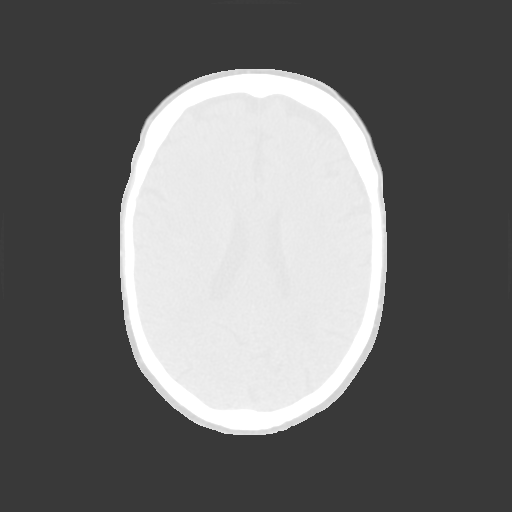
[im 75/128  mediastinal]
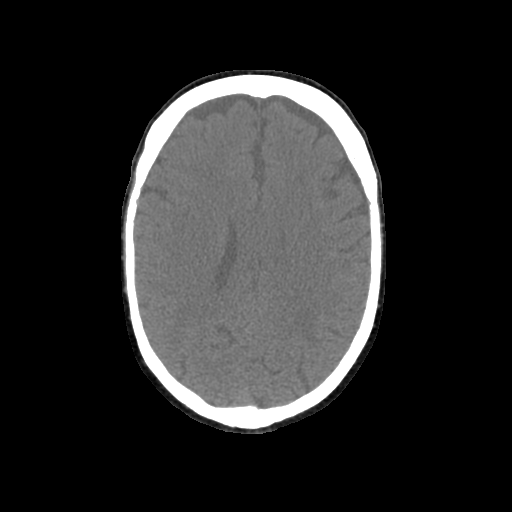
[im 75/128  lung]
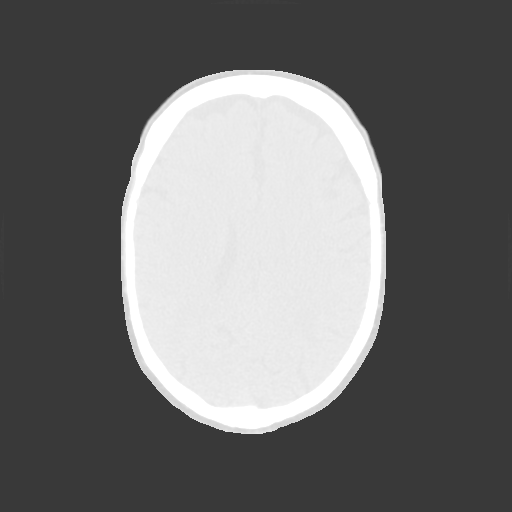
[im 91/128  lung]
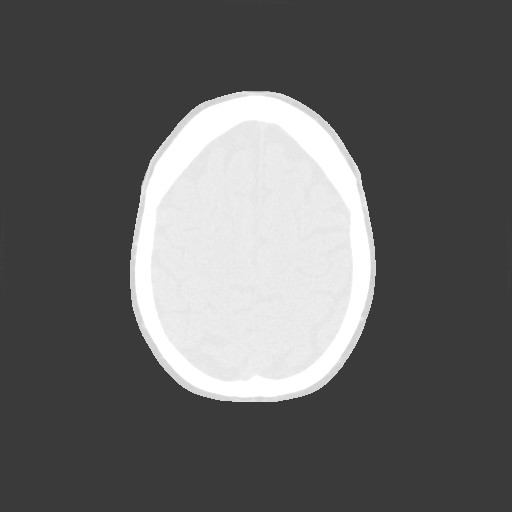
[im 109/128  lung]
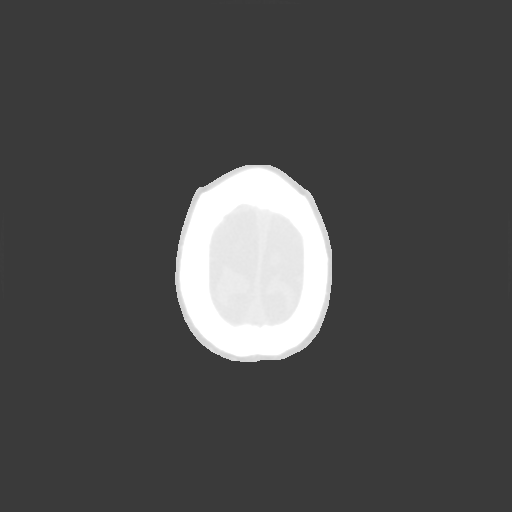

[Series 601: cor 1 · coronal · 0.50mm/px · 8 of 154 slices shown]
[im 18/154  lung]
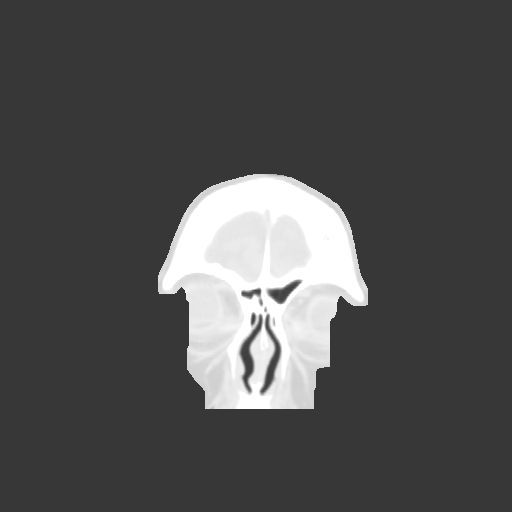
[im 35/154  lung]
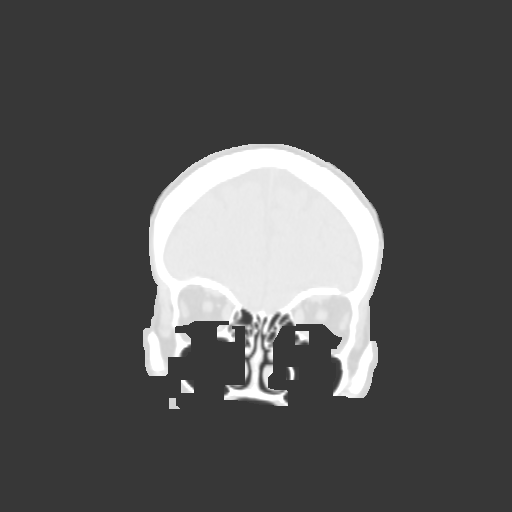
[im 52/154  lung]
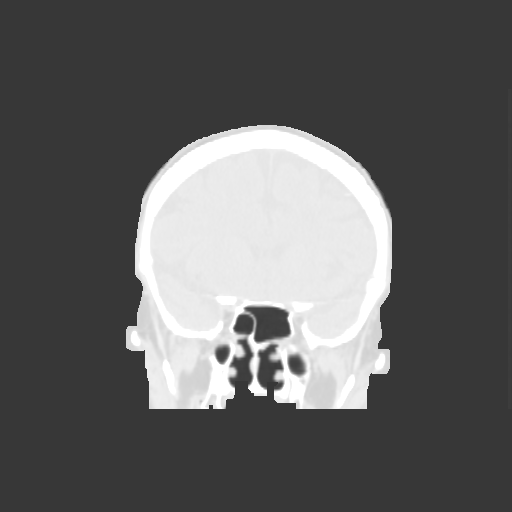
[im 69/154  lung]
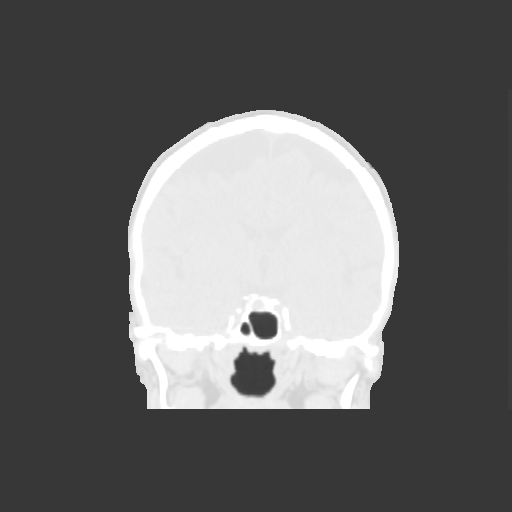
[im 86/154  lung]
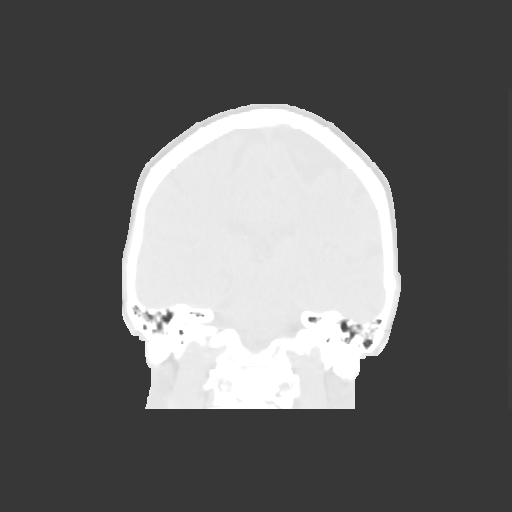
[im 103/154  lung]
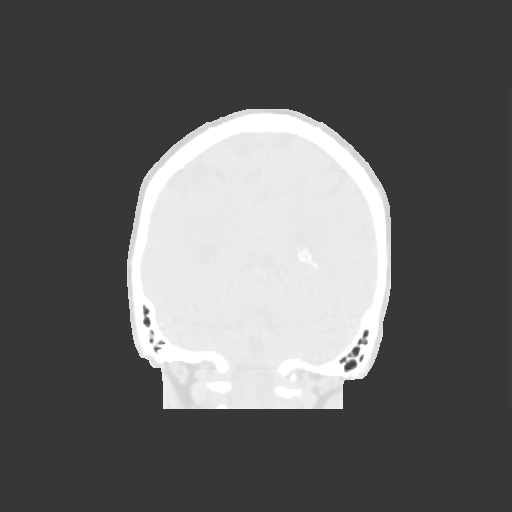
[im 120/154  lung]
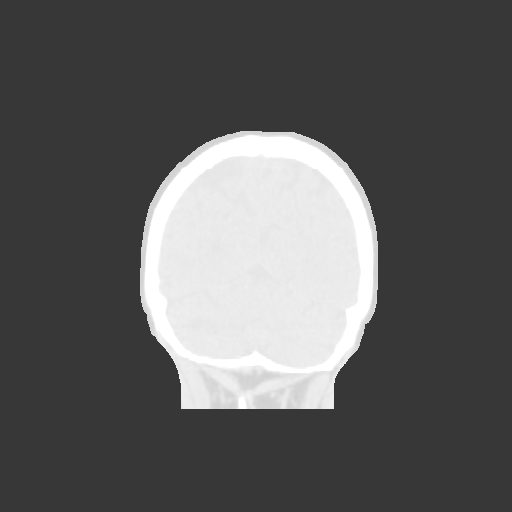
[im 137/154  lung]
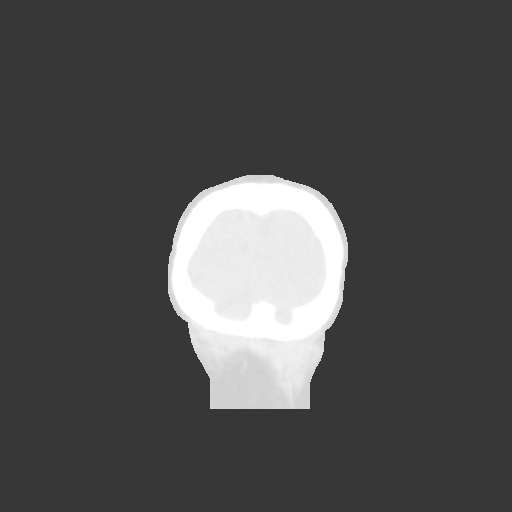

[Series 602: sag 1 · sagittal · 0.50mm/px · 2 of 123 slices shown]
[im 18/123  lung]
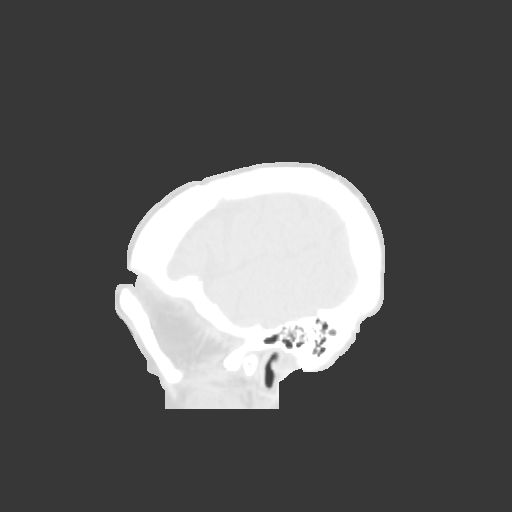
[im 35/123  lung]
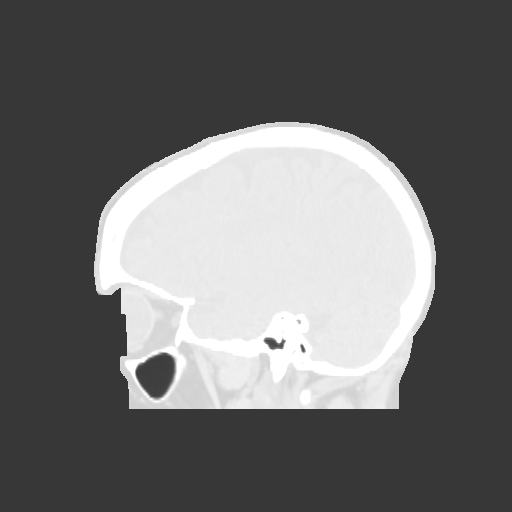

[17 of 30 positions shown; findings below may reference images not displayed]

FINDINGS: Upper abdomen is unremarkable. The course of the thoracic esophagus is unremarkable. Thyroid is normal. Chest wall structures are normal. Mildly enlarged right paratracheal lymph node measures 10 to 11 mm. Multifocal coronary atherosclerosis. This e.g. No cardiomegaly. Moderately severe aortic atherosclerosis. Central airways are widely patent. Mild edema is noted. There is a lobular mass with mild spiculation in the right upper lobe corresponding to the radiographic abnormality. This measures 3.1 x 2.1 cm. No other nodule or mass. Mild atelectasis is noted. Mild emphysematous changes. No aggressive bone lesions are seen.
IMPRESSION: 
IMPRESSION: :
1. Right upper lobe mass with spiculation up to 3.1 cm which is highly suspicious for malignancy
2. Mild right peritoneal cavity
3. Moderately severe atherosclerosis

## 2023-02-25 IMAGING — CT CT CHEST WITHOUT CONTRAST
3 of 5 series · 16 of 30 positions shown, 19 images · non-contrast
Comparison: 10/11/2010

Abn xray
Addendum:
(#[id number]
\F\[HOSPITAL]\F\
Communicated to: Jack, Kenji via [HOSPITAL]
On behalf of: Dr. Amairany Weis
By: Ninikona Andriadze
At: [DATE]
On: 02/25/2023 EST
\F\/[HOSPITAL]\F\#)
FINAL REPORT:
INDICATION: Cough, persistent
Exam: CT of the chest without contrast
TECHNIQUE: Multiplanar images were reconstructed through the chest.
All CT scans at this facility use iterative reconstruction technique, dose modulation and/or weight based dosing when appropriate to reduce radiation dose to as low as reasonably achievable.

[Series 4: thins · axial · 0.70mm/px · z∈[-293,-18]mm · 10 of 551 slices shown, 13 images]
[im 56/551  mediastinal]
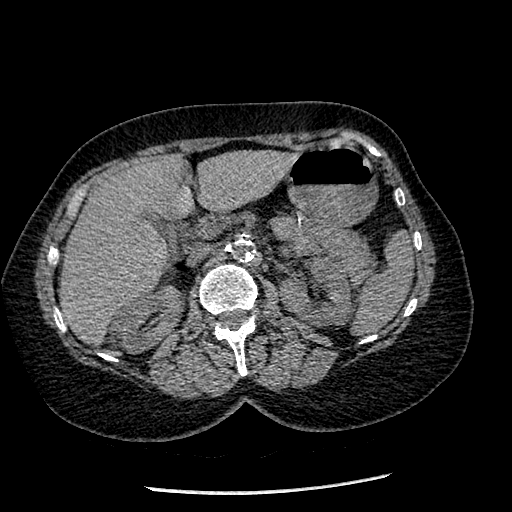
[im 56/551  lung]
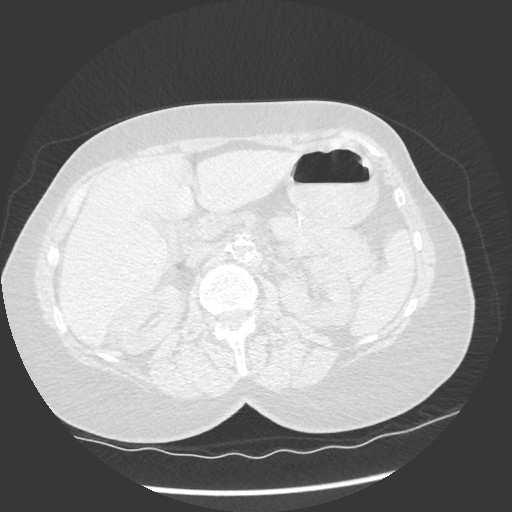
[im 111/551  lung]
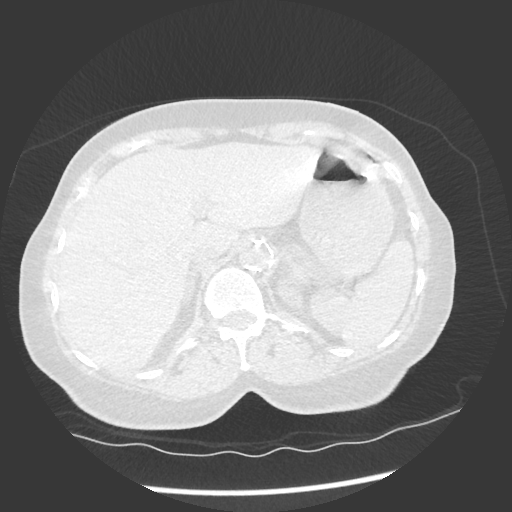
[im 166/551  lung]
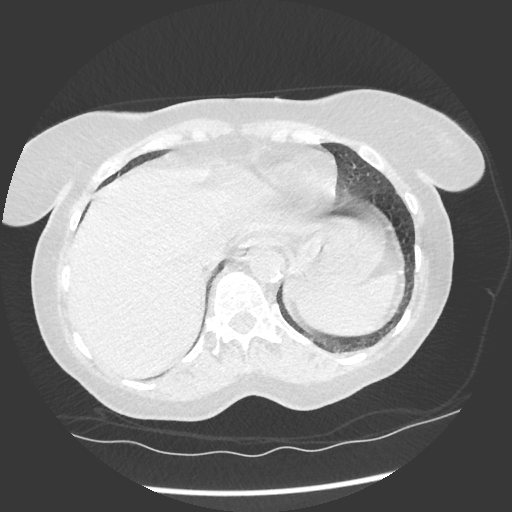
[im 221/551  lung]
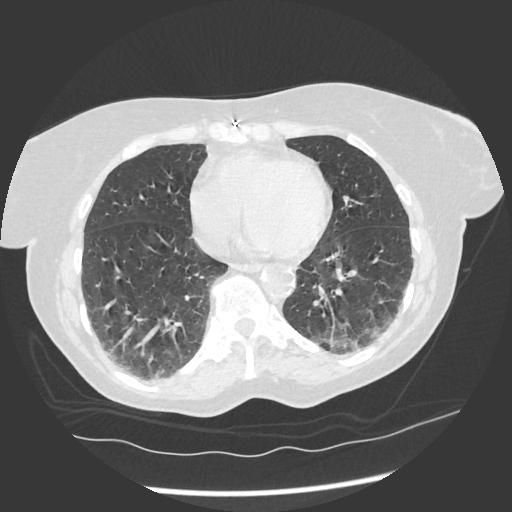
[im 267/551  mediastinal]
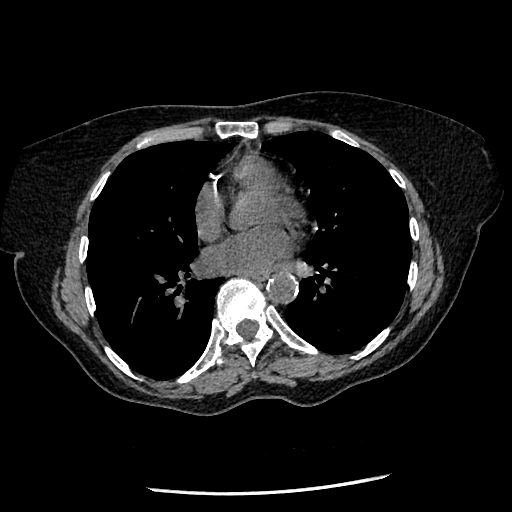
[im 267/551  lung]
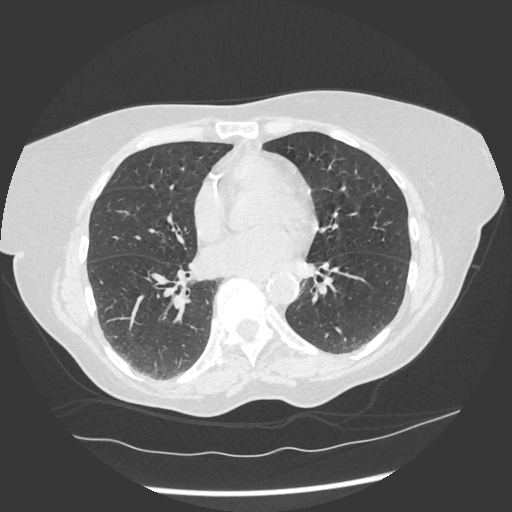
[im 276/551  lung]
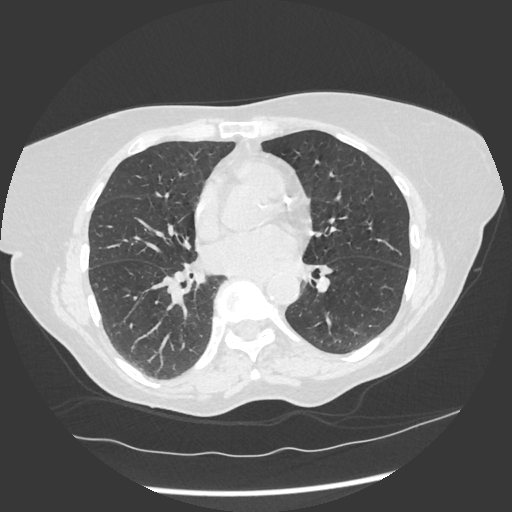
[im 331/551  lung]
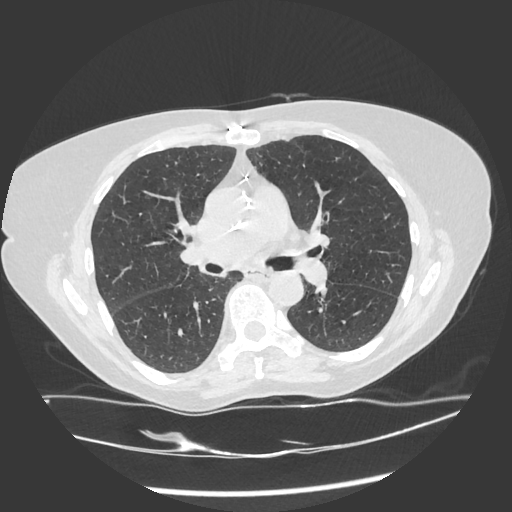
[im 386/551  lung]
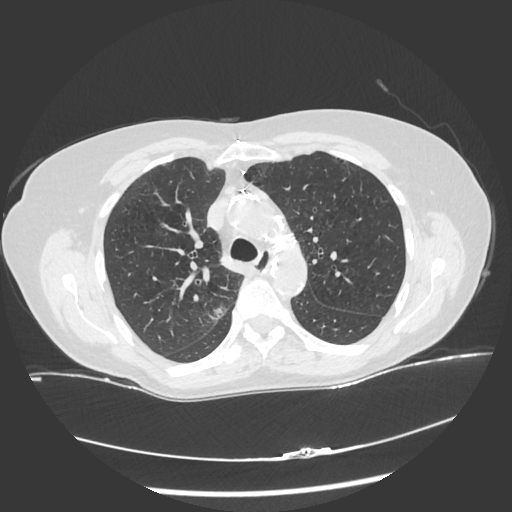
[im 441/551  mediastinal]
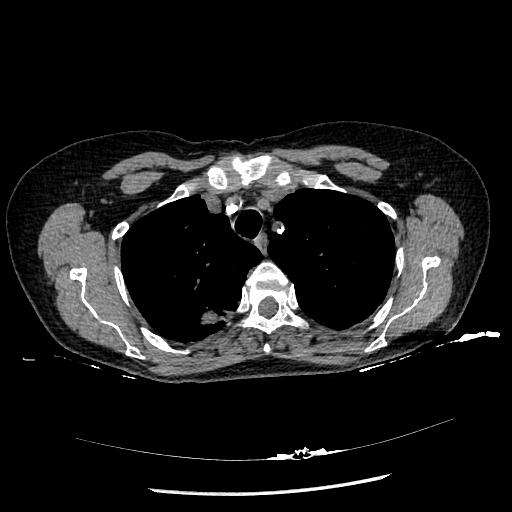
[im 441/551  lung]
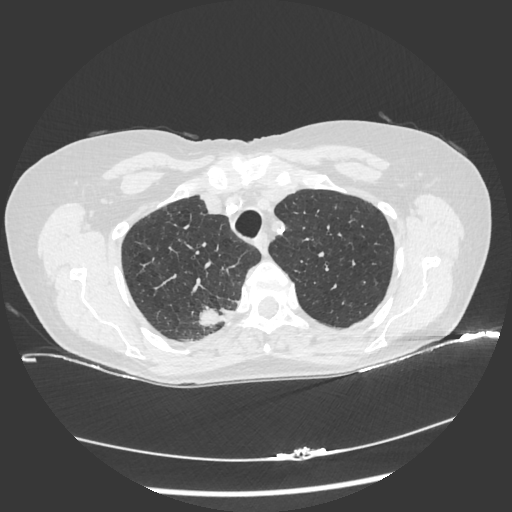
[im 496/551  lung]
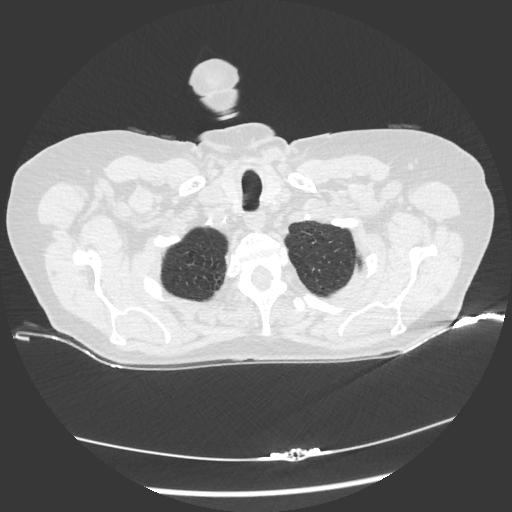

[Series 601: cor mips · coronal · 0.70mm/px · 4 of 295 slices shown]
[im 59/295  lung]
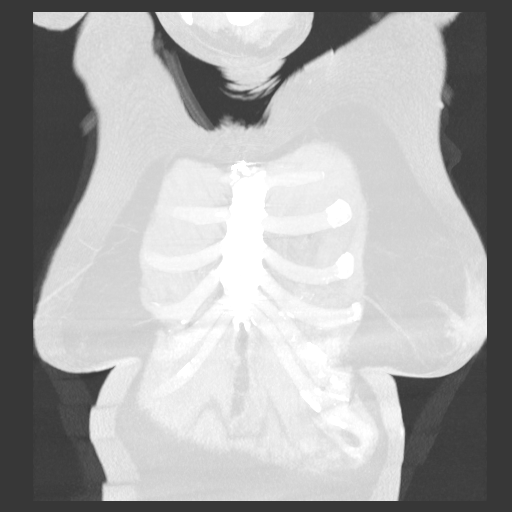
[im 118/295  lung]
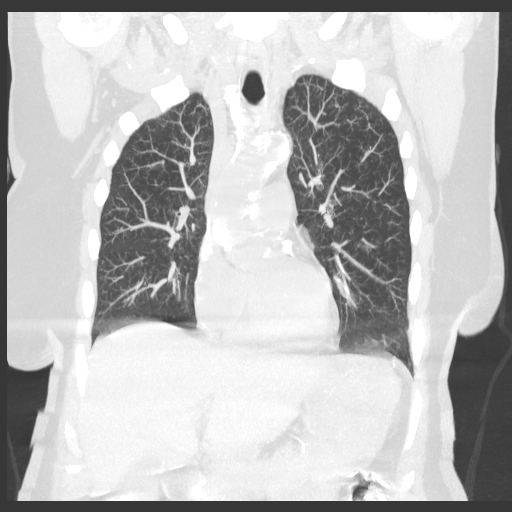
[im 177/295  lung]
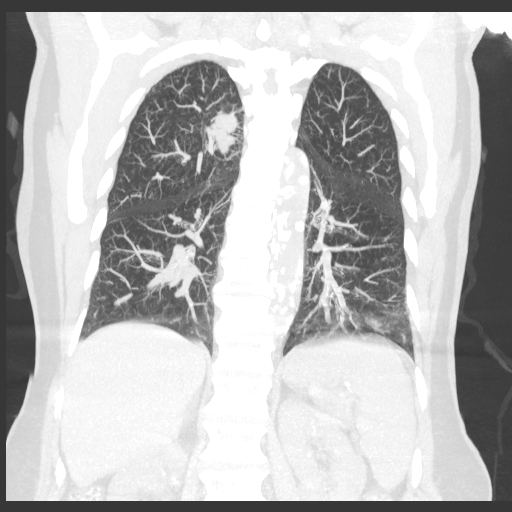
[im 236/295  lung]
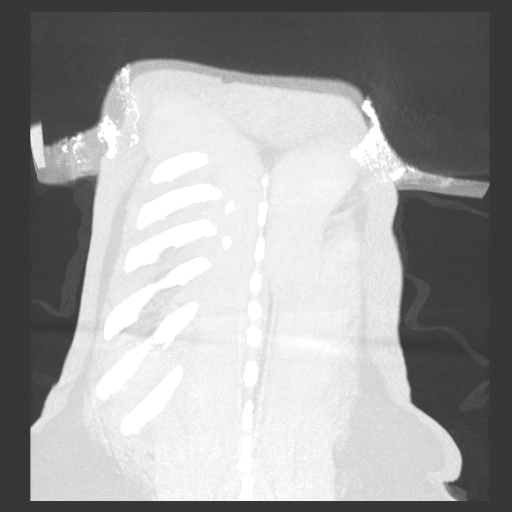

[Series 602: sag 1 · sagittal · 0.70mm/px · 2 of 285 slices shown]
[im 57/285  lung]
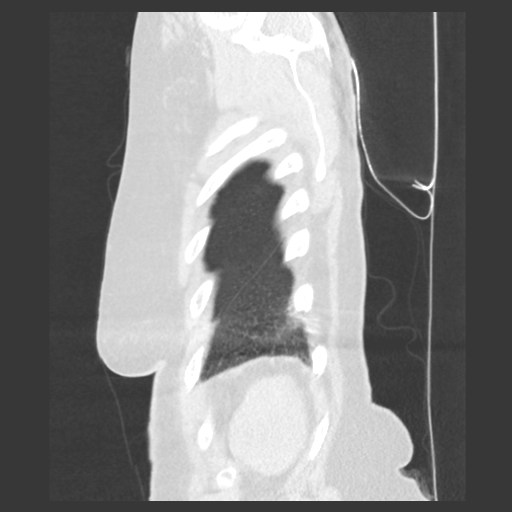
[im 114/285  lung]
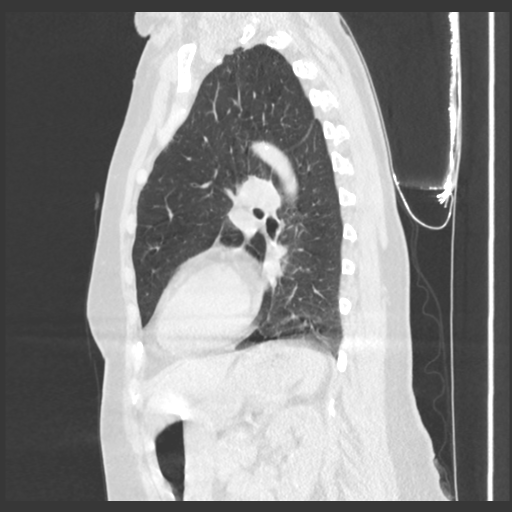

[16 of 30 positions shown; findings below may reference images not displayed]

FINDINGS: Upper abdomen is unremarkable. The course of the thoracic esophagus is unremarkable. Thyroid is normal. Chest wall structures are normal. Mildly enlarged right paratracheal lymph node measures 10 to 11 mm. Multifocal coronary atherosclerosis. This e.g. No cardiomegaly. Moderately severe aortic atherosclerosis. Central airways are widely patent. Mild edema is noted. There is a lobular mass with mild spiculation in the right upper lobe corresponding to the radiographic abnormality. This measures 3.1 x 2.1 cm. No other nodule or mass. Mild atelectasis is noted. Mild emphysematous changes. No aggressive bone lesions are seen.
IMPRESSION: 
IMPRESSION: :
1. Right upper lobe mass with spiculation up to 3.1 cm which is highly suspicious for malignancy
2. Mild right peritoneal cavity
3. Moderately severe atherosclerosis

## 2023-03-06 IMAGING — DX XR CHEST 1 VIEW
1 series · 2 of 2 positions shown · non-contrast
Comparison: 02/25/2023

1 hour post right side lung biopsy
FINAL REPORT:
XR CHEST 1 VIEW
INDICATION: Status post right lung biopsy.
TECHNIQUE: AP views of the chest.

[Series 7570: AP · U · 0.10mm/px · 2 of 2 slices shown]
[im 1/2]
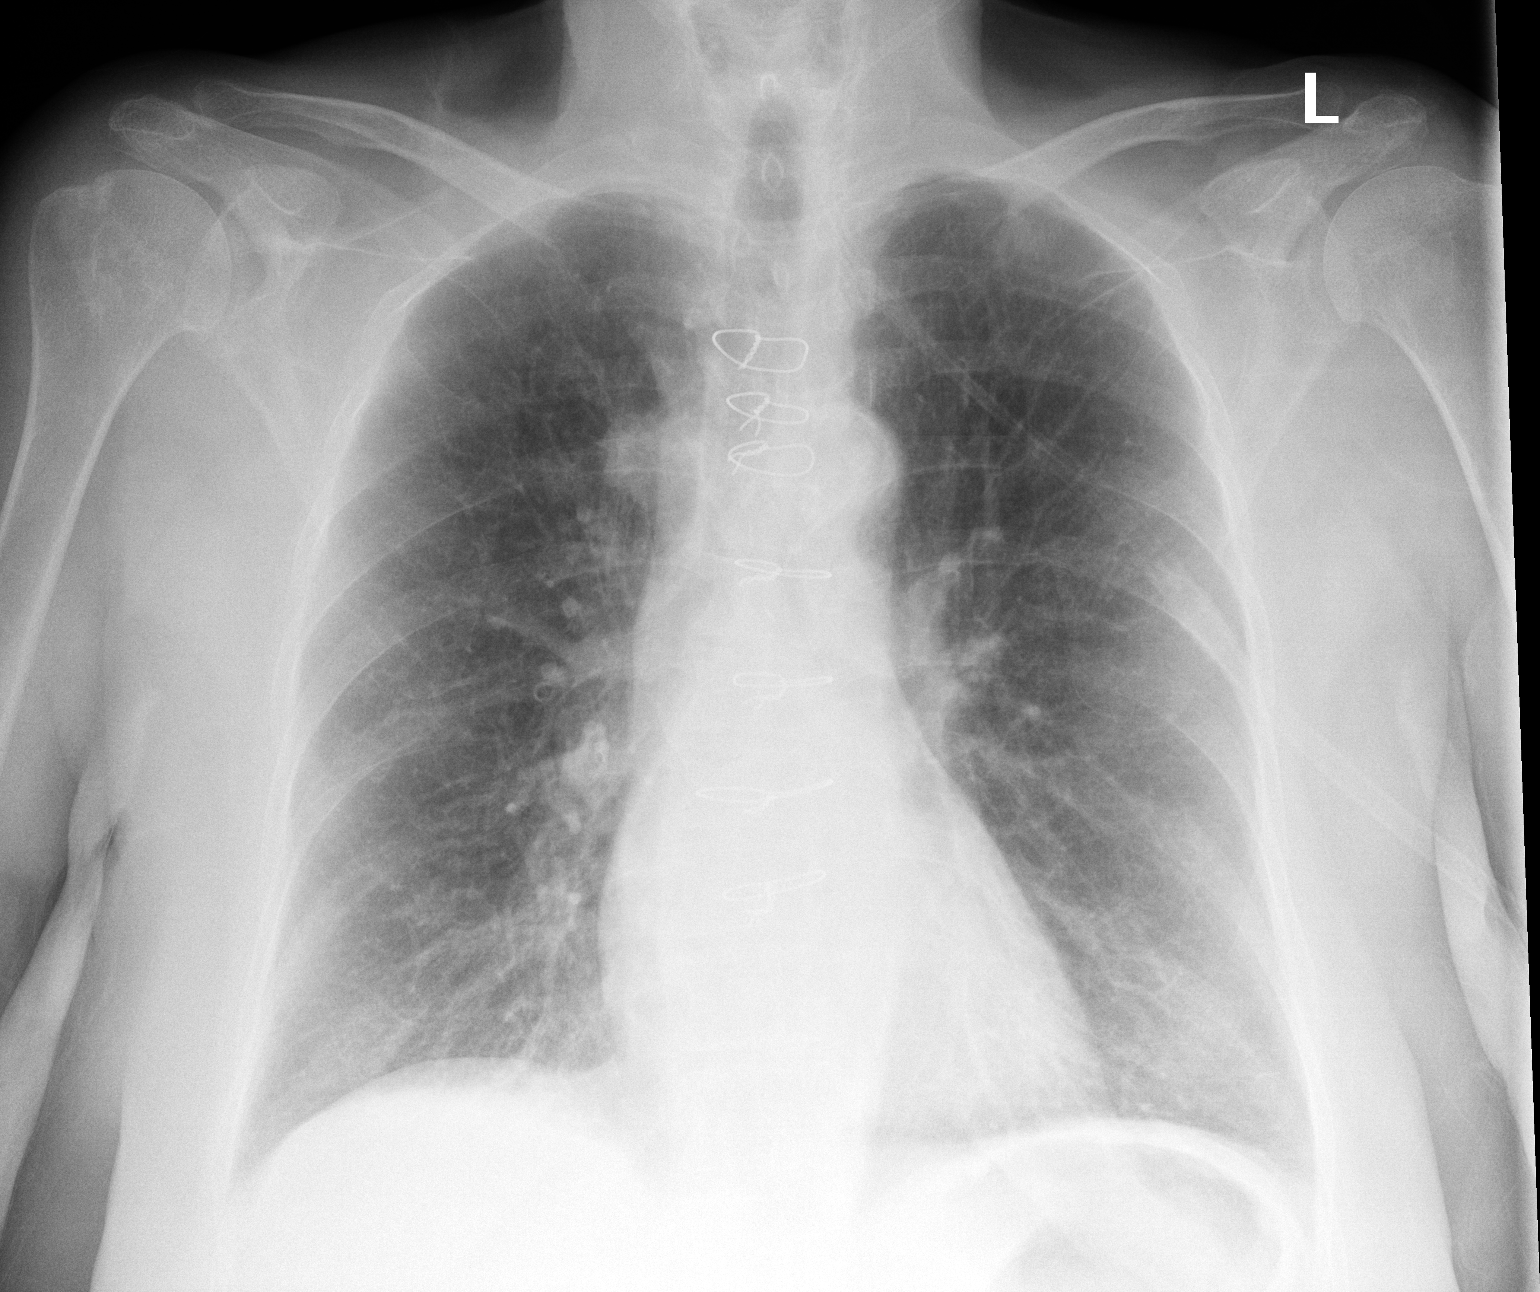
[im 2/2]
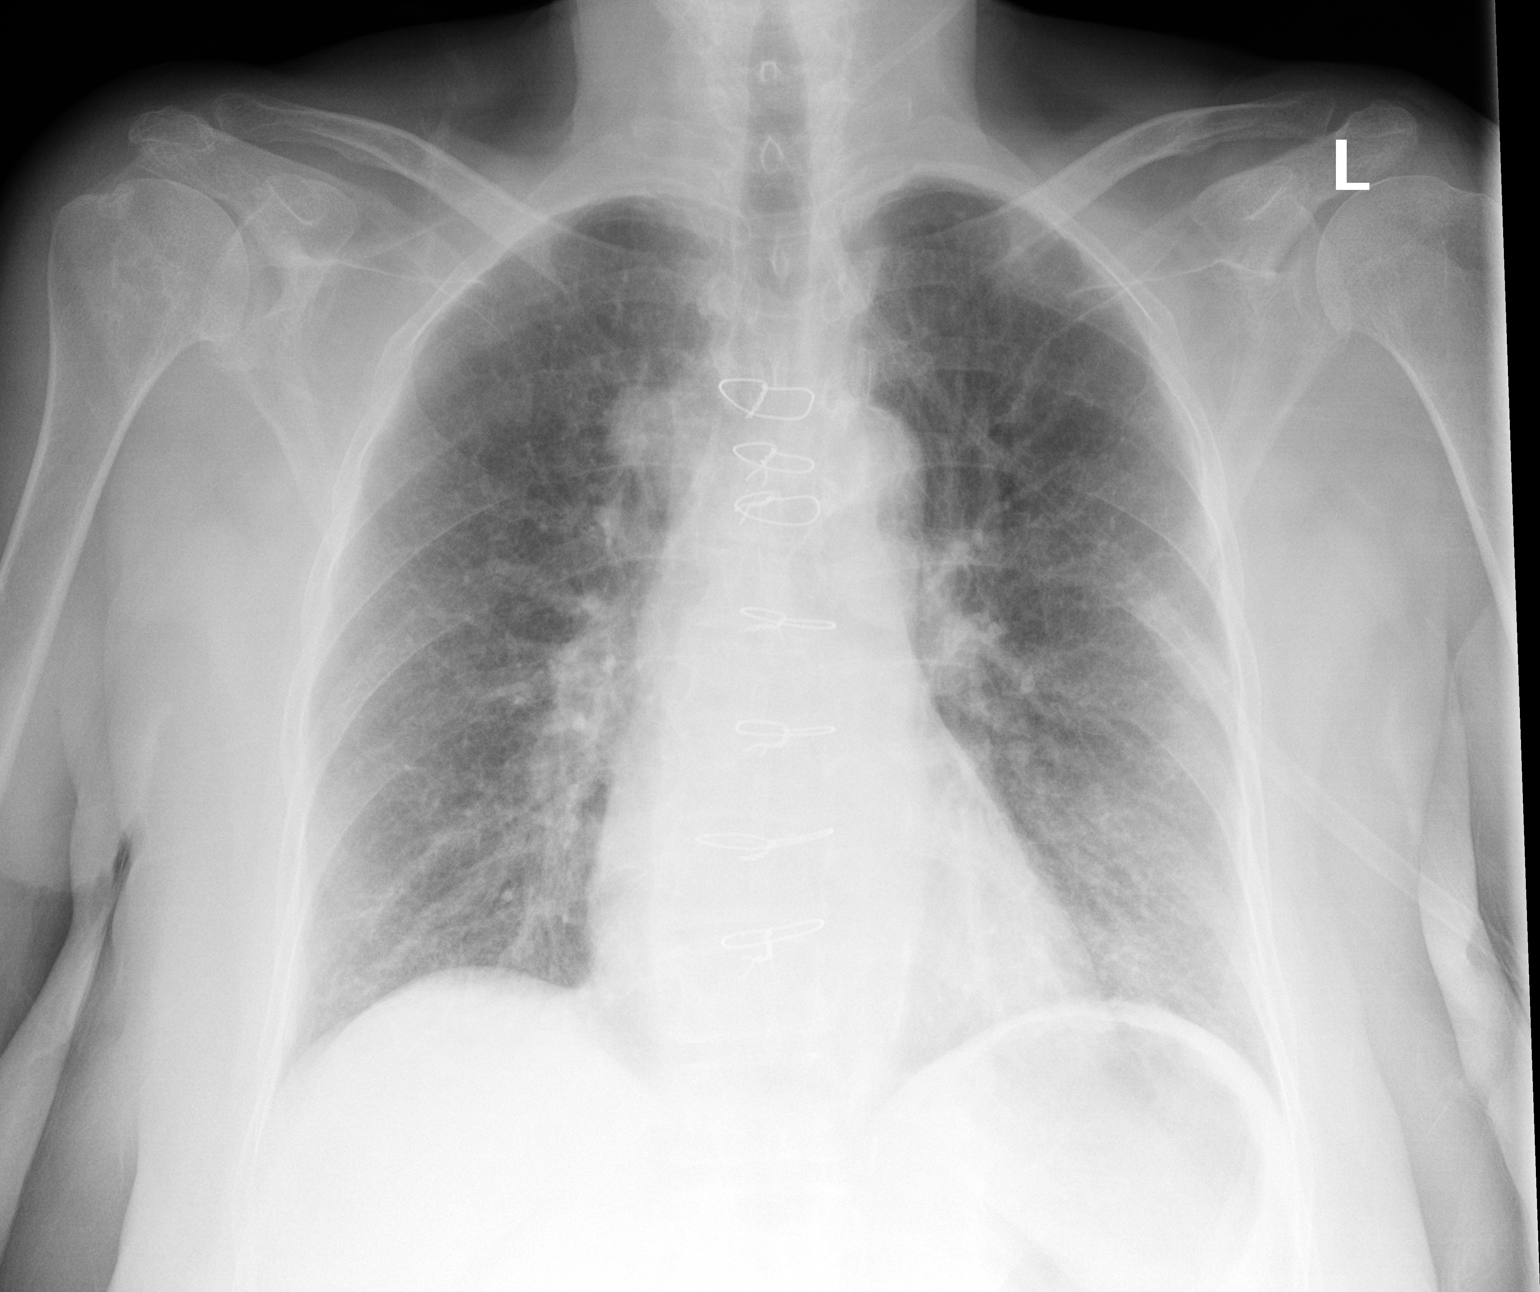

[2 of 2 positions shown; findings below may reference images not displayed]

FINDINGS: The cardiac silhouette is within normal limits in size. Postsurgical changes from CABG. The mediastinum is in the midline. Stable nodular opacity in the right paratracheal region. The costophrenic angles are sharp. No pneumothorax. The bones and soft tissues are unchanged.
IMPRESSION: 
IMPRESSION: No evidence of pneumothorax status post right lung biopsy.

## 2023-03-06 IMAGING — CT CT GUIDED NEEDLE BIOPSY LUNG
1 of 2 series · 14 of 29 positions shown, 18 images · non-contrast
Comparison: CT chest from 02/25/2023

CT RIGHT LUNG BIOPSY
Dr. Gurpreet Pandya
[facility] - CT
[facility] - RNs
6 minutes sedation time
FINAL REPORT:
Brief operative note:
Operator: Waring, Apoorv
Moatshe: None
Pre and postop diagnosis: Other nonspecific abnormal finding of lung field
Procedure: CT GUIDED NEEDLE BIOPSY LUNG
Anesthesia: Local using 1% lidocaine , moderate conscious sedation using graded doses of fentanyl and Versed
Specimen: Multiple core biopsies
Estimated blood loss: Minimal
Blood administered: None
Complications: None
Implants: None
Full operative report:
INDICATION: Other nonspecific abnormal finding of lung field
Exam: CT guided biopsy of right upper lobe nodule.
All CT scans at this facility use iterative reconstruction technique, dose modulation and/or weight based dosing when appropriate to reduce radiation dose to as low as reasonably achievable.

[Series 2: biopsy · axial · 0.68mm/px · z∈[-162,-52]mm · 14 of 26 slices shown, 18 images]
[im 2/26  mediastinal]
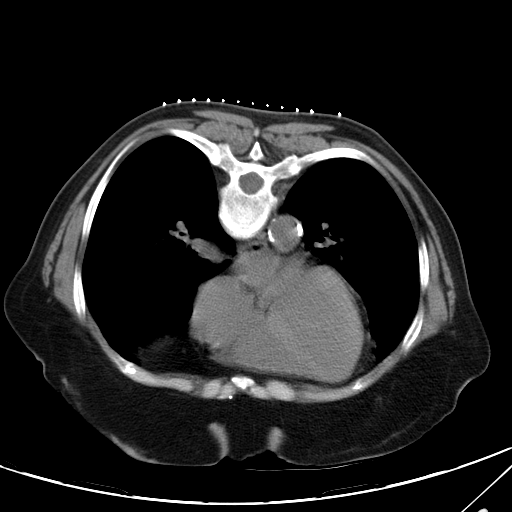
[im 2/26  lung]
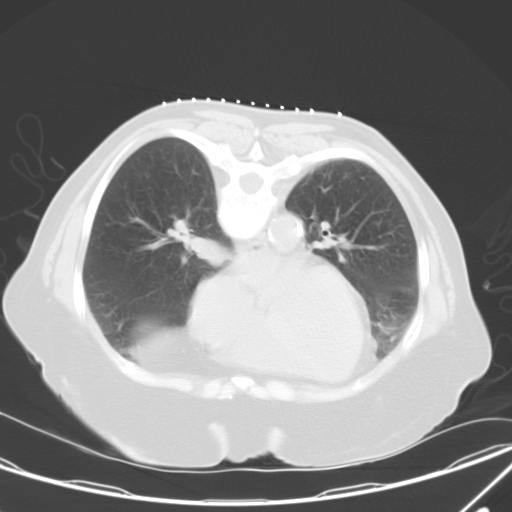
[im 4/26  lung]
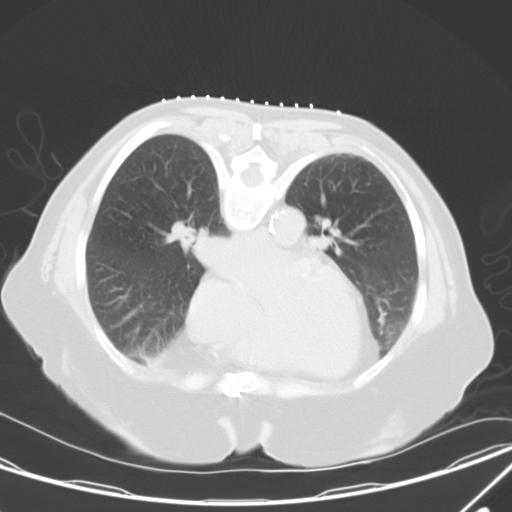
[im 6/26  lung]
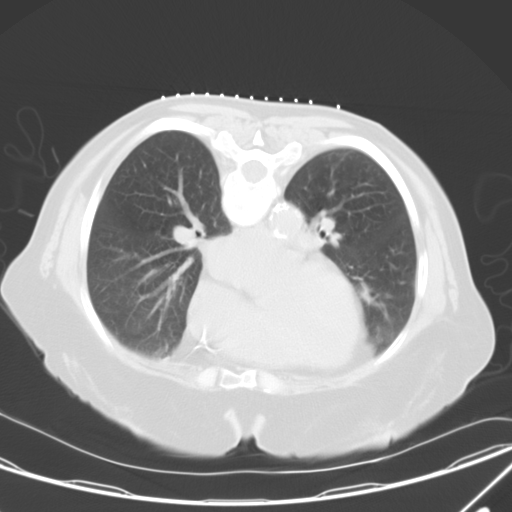
[im 8/26  lung]
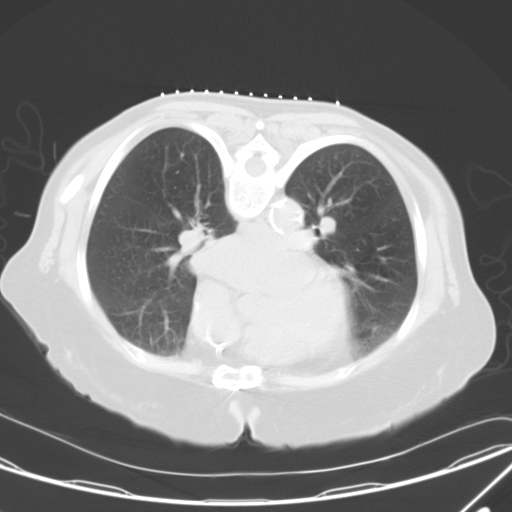
[im 9/26  mediastinal]
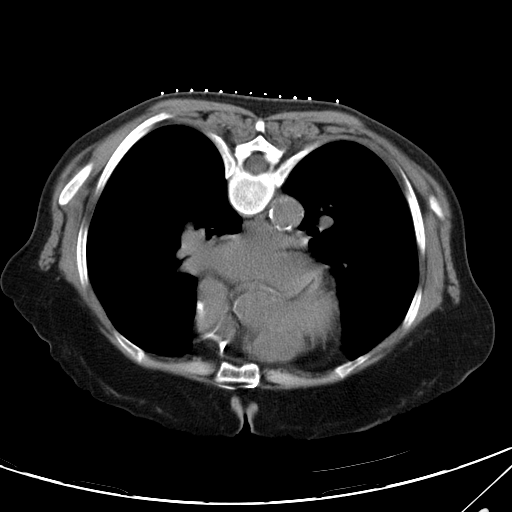
[im 9/26  lung]
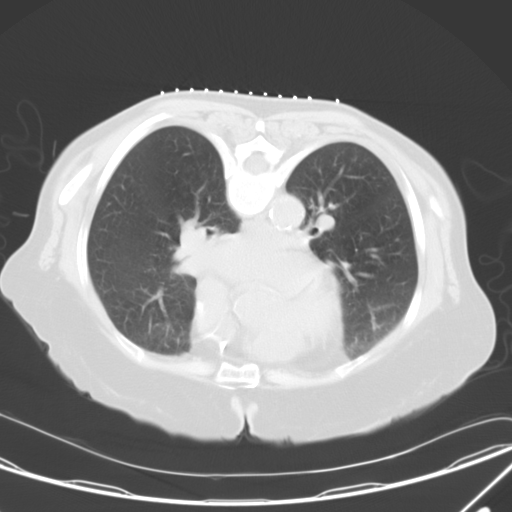
[im 11/26  lung]
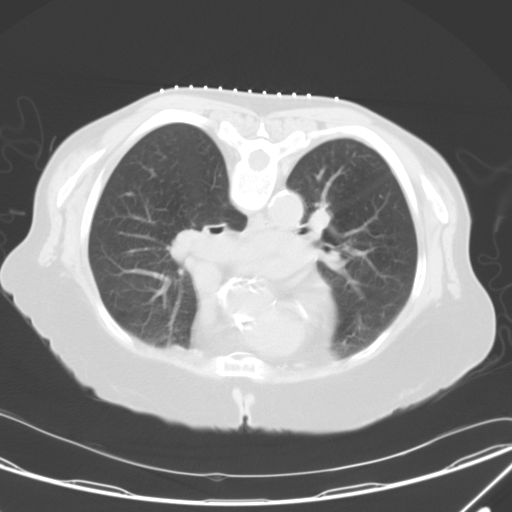
[im 12/26  lung]
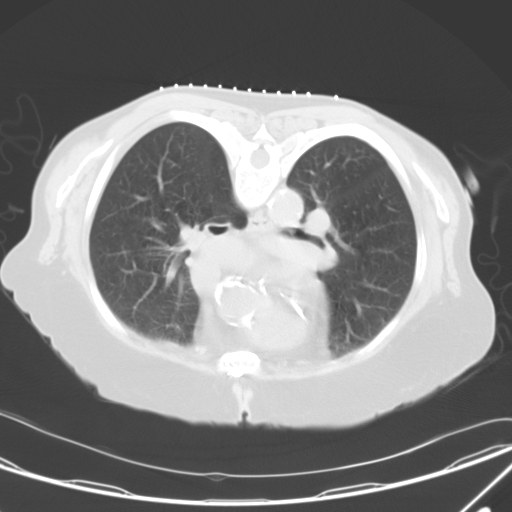
[im 13/26  lung]
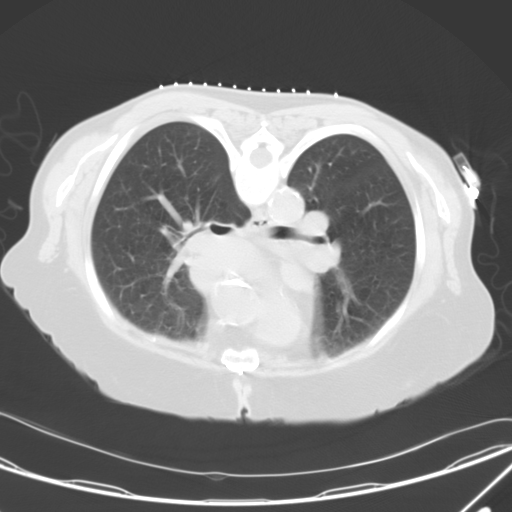
[im 15/26  mediastinal]
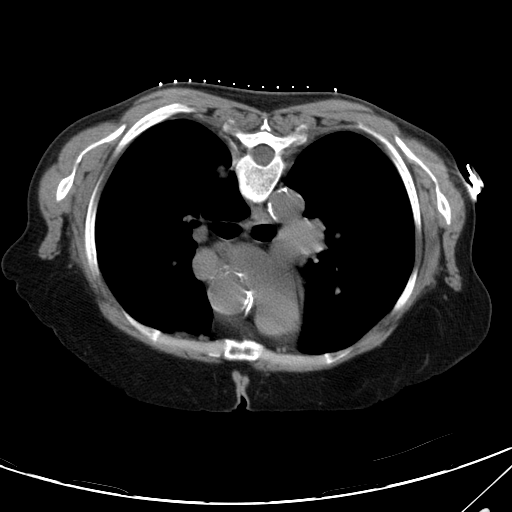
[im 15/26  lung]
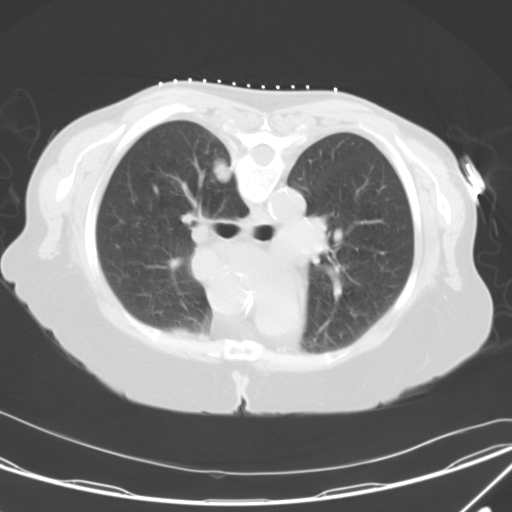
[im 17/26  lung]
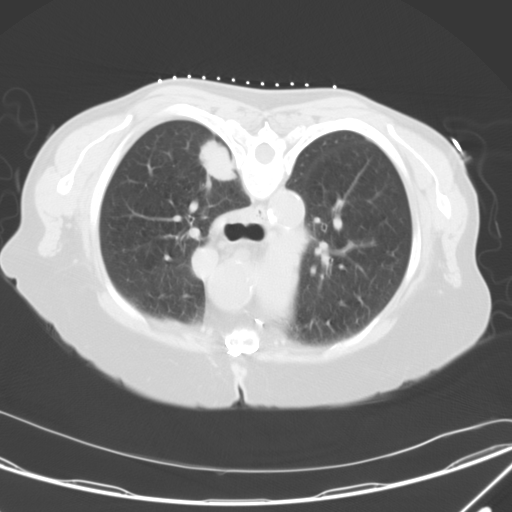
[im 18/26  lung]
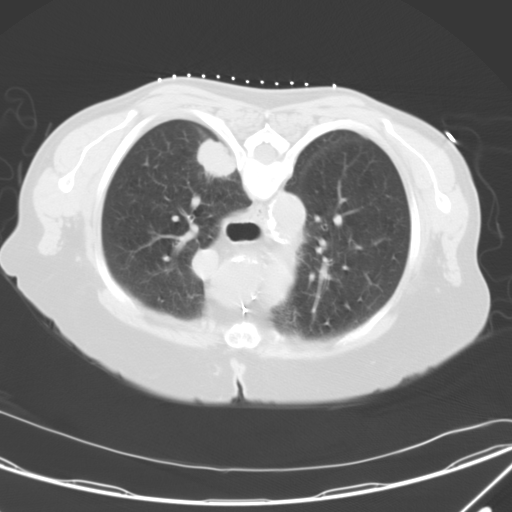
[im 20/26  lung]
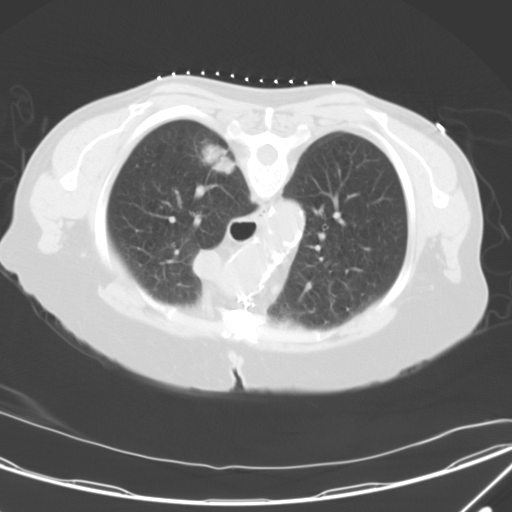
[im 22/26  mediastinal]
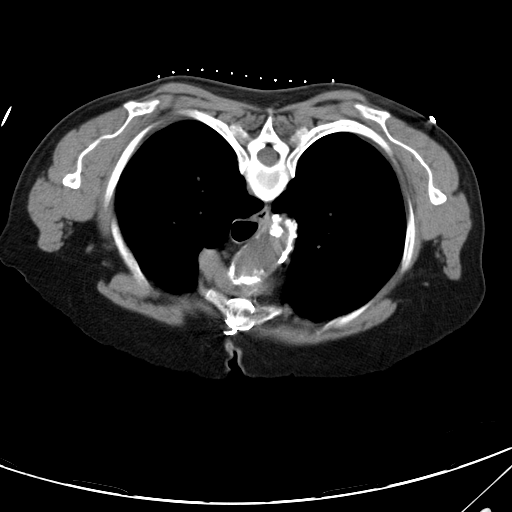
[im 22/26  lung]
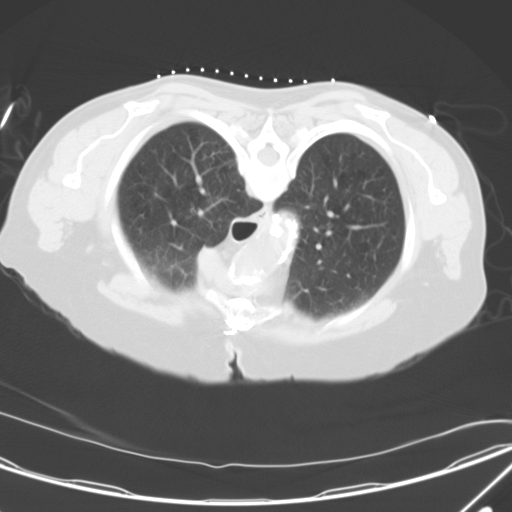
[im 24/26  lung]
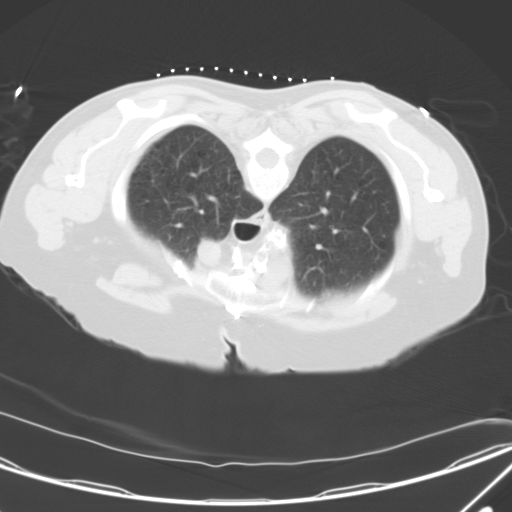

[14 of 29 positions shown; findings below may reference images not displayed]

FINDINGS: The risks and benefits of the procedure were discussed with the patient. Informed consent was obtained. The patient was placed on the CT table in prone position. Limited CT of the area of interest shows right upper lobe nodule. An appropriate access site was selected. The area was sterilely prepared and draped in the usual fashion. Moderate conscious sedation was obtained using graded doses of fentanyl and Versed for a total sedation time of 6 minutes. A trained intraprocedural observer was present at all times. Direct face-to-face monitoring was provided during sedation. Local anesthesia was obtained using 1% lidocaine. A 17-gauge coaxial needle was advanced under CT guidance to the target of interest. Multiple 18-gauge core biopsies were obtained. The needle was then removed and an occlusive bandage was placed over the access site. Postprocedural imaging showed no immediate postprocedural complications.
IMPRESSION: 
IMPRESSION: Technically successful CT-guided biopsy of right upper lobe nodule
Final pathology is pending.

## 2023-03-08 IMAGING — CT PET^TMC_SKULL_THIGH (Adult)
7 series · 16 of 16 positions shown · non-contrast
Comparison: CT head and chest from 02/25/2023

FINAL REPORT:
INDICATION: Right upper lobe lung nodule, initial
Exam: PET/CT utilizing 12.36 mCi of F-18 FDG administered via the left antecubital fossa IV. Blood glucose at time of testing was 114. Axial CT images from the mid skull to the upper thighs  was obtained for attenuation correction and anatomic localization. These images and 3-D reconstructions were performed on an independent workstation.

[Series 3: pet ctac · axial · 6.0mm · 4.07mm/px · z∈[-1088,-302]mm · 3 of 263 slices shown]
[im 1/263]
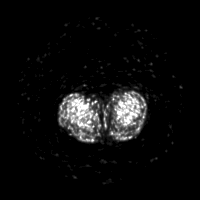
[im 132/263]
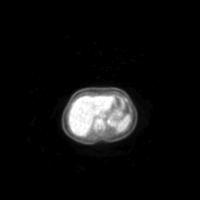
[im 263/263]
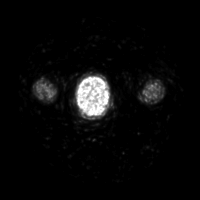

[Series 4: ct wb · axial · 0.98mm/px · z∈[-1090,-302]mm · 5 of 395 slices shown]
[im 1/395  soft-tissue]
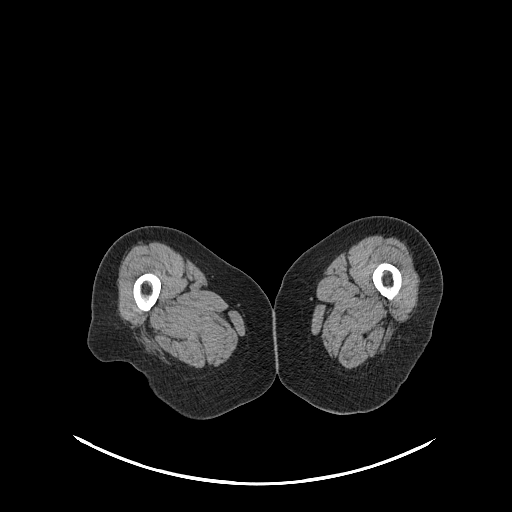
[im 99/395  soft-tissue]
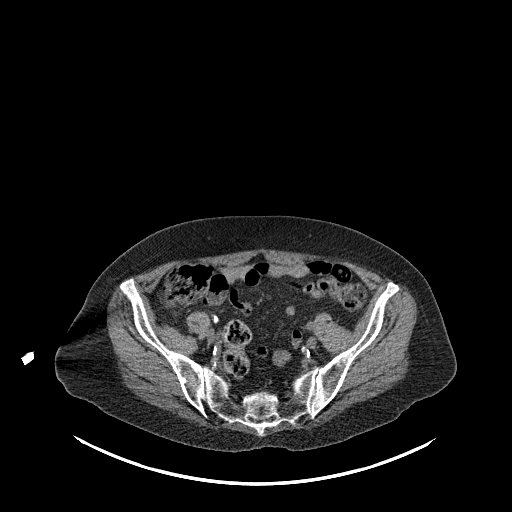
[im 198/395  soft-tissue]
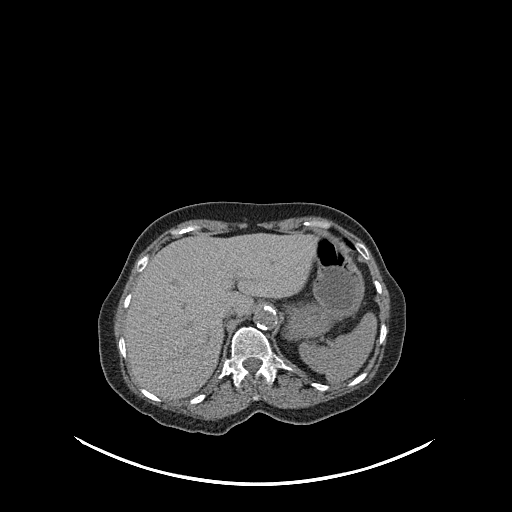
[im 296/395  soft-tissue]
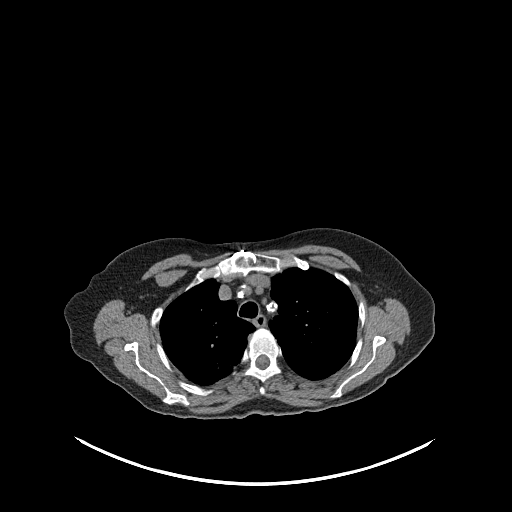
[im 395/395  soft-tissue]
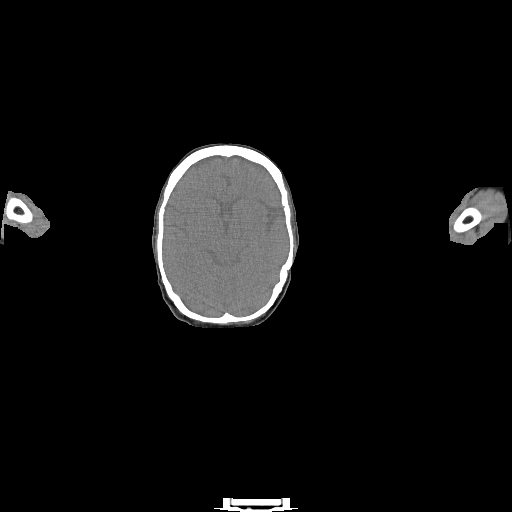

[Series 5: pet nac · axial · 6.0mm · 4.07mm/px · z∈[-1088,-302]mm · 3 of 263 slices shown]
[im 1/263]
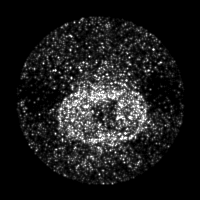
[im 132/263]
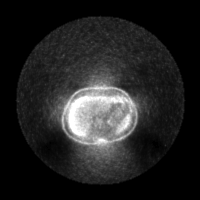
[im 263/263]
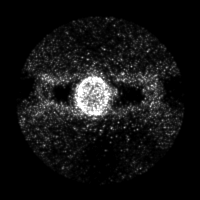

[Series 7: ct wb sag · sagittal · 0.46mm/px · 1 of 84 slices shown]
[im 1/84  soft-tissue]
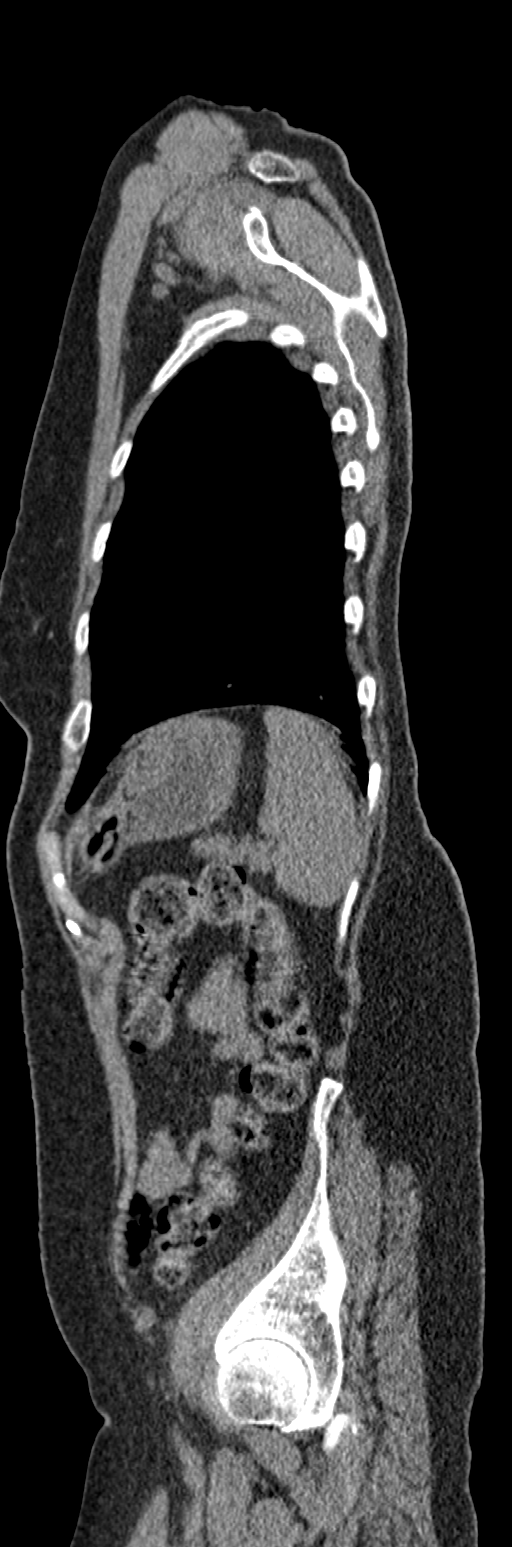

[Series 603: <mip collection> · coronal · 1.68mm/px · 1 of 32 slices shown]
[im 1/32]
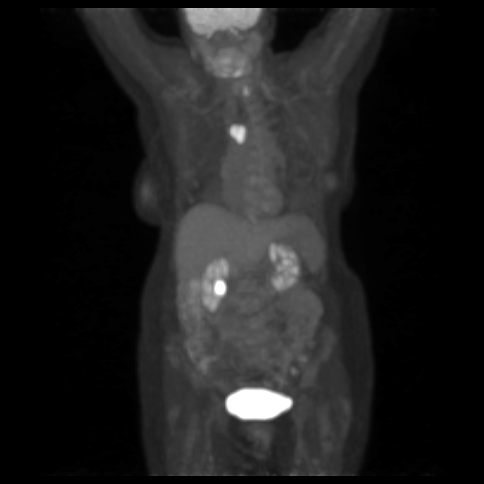

[Series 604: fused tra · 2 of 157 slices shown]
[im 1/157]
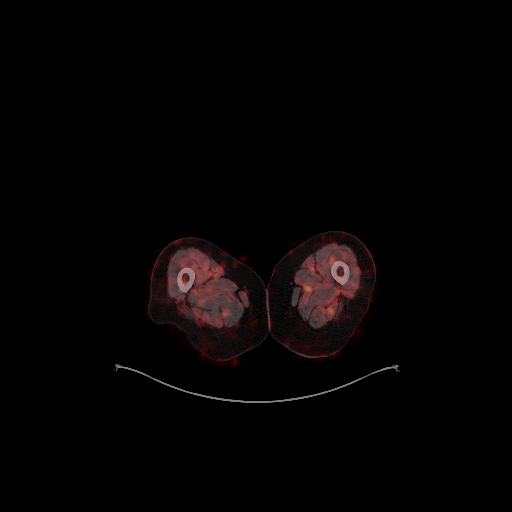
[im 157/157]
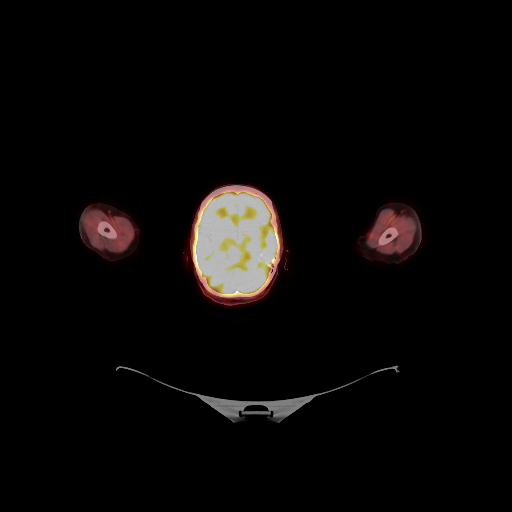

[Series 605: fused cor · 1 of 62 slices shown]
[im 1/62]
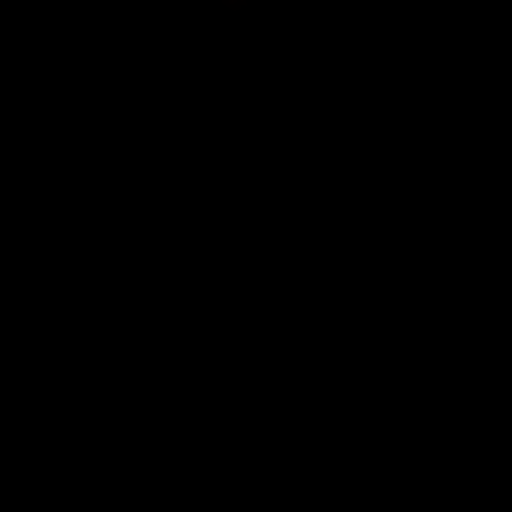

[16 of 16 positions shown; findings below may reference images not displayed]

FINDINGS: PET/CT:
Hypermetabolic right upper lobe mass measures 2.6 x 2.2 cm with max SUV of 16.2.
The background hepatic uptake measures up to 3.12 Max SUV.
CT head and neck:
Visualized intracranial structures: Unremarkable.
Orbits, paranasal sinuses, and skull base: Unremarkable.
Nasopharynx: Unremarkable.
Suprahyoid neck: Unremarkable oropharynx, oral cavity, parapharyngeal space, and retropharyngeal space.
Infrahyoid neck: Normal larynx, hypopharynx, and supraglottis.
Thyroid: Unremarkable.
Thoracic inlet: Lung apices are unremarkable.
Lymph nodes: No suspicious cervical adenopathy.
Vascular structures: Unremarkable.
CT thorax:
Pleural effusion: None.
Central airways: Patent
Adenopathy: No suspicious axillary, hilar, or mediastinal adenopathy.
Heart: Unremarkable. No pericardial effusion.
CT abdomen/pelvis:
Gallbladder and biliary tree: Unremarkable.
Liver: Unremarkable.
Adrenal glands: Unremarkable.
Kidneys and ureters: Unremarkable.
Pancreas: Unremarkable.
Spleen: Unremarkable.
Vessels: The abdominal aorta is nonaneurysmal.
Peritoneum: No pneumoperitoneum or free intraperitoneal fluid collections.
Bowel: Unremarkable.
Bladder: Unremarkable.
Reproductive organs: Unremarkable.
Lymph nodes
Retroperitoneal: Unremarkable.
Mesenteric: Unremarkable.
Pelvic: Unremarkable.
Abdominal wall: Unremarkable.
Bones: Unremarkable.
IMPRESSION: 
IMPRESSION: 1. Hypermetabolic right upper lobe mass which has been recently biopsied with pathology pending. This remains highly suspicious for malignancy. No additional hypermetabolic activity to suggest metastatic disease at this time.

## 2023-04-13 IMAGING — MR MRI BRAIN WITH AND WITHOUT CONTRAST
15 series · 48 of 48 positions shown · IV contrast (gadolinium)
Comparison: none

FINAL REPORT:
MRI of the brain with and without contrast
HISTORY: Lung cancer
TECHNIQUE: Multiplanar and multisequence images were acquired through the brain before and after the administration of IV gadolinium contrast. Informed consent was obtained prior to the administration of IV contrast.

[Series 2: survey_mpr_sag · oblique · 1.6mm · 1.60mm/px · 1 of 5 slices shown]
[im 1/5]
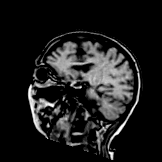

[Series 3: survey_mpr_cor · coronal · 1.6mm · 1.60mm/px · 1 of 3 slices shown]
[im 1/3]
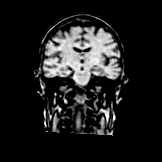

[Series 4: survey_mpr_(person_name) · axial · 1.6mm · 1.60mm/px · 1 of 3 slices shown]
[im 1/3]
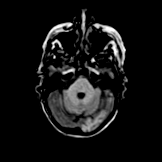

[Series 5: T1 · sagittal · 4.0mm · 0.72mm/px · 1 of 25 slices shown (1 of 4)]
[im 1/25]
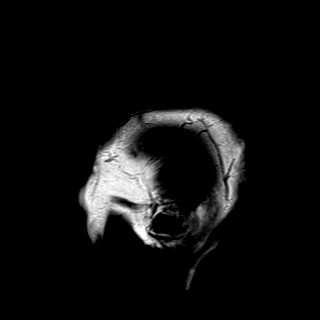

[Series 6: ax dwi_tracew · axial · 4.0mm · 0.60mm/px · 1 of 30 slices shown]
[im 1/30]
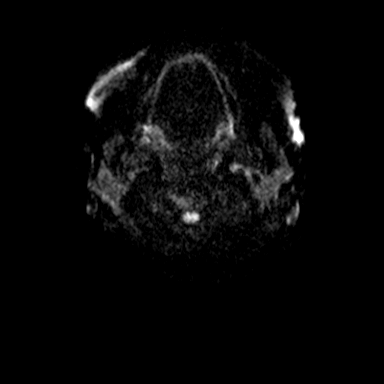

[Series 7: ax dwi_adc · axial · 4.0mm · 0.60mm/px · 1 of 30 slices shown]
[im 1/30]
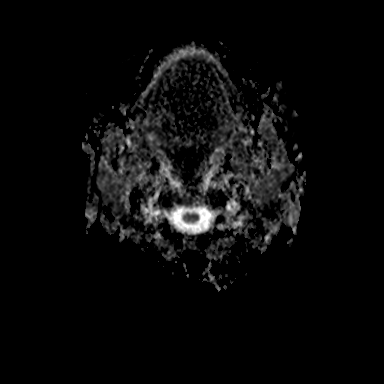

[Series 8: ax dwi_exp · axial · 4.0mm · 0.60mm/px · 1 of 30 slices shown]
[im 1/30]
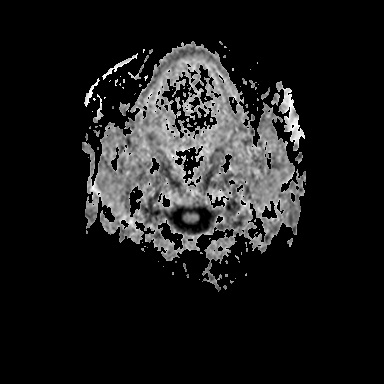

[Series 9: T1 · axial · 4.0mm · 0.72mm/px · 1 of 30 slices shown (2 of 4)]
[im 1/30]
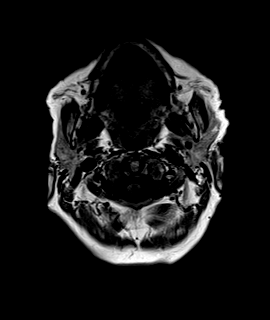

[Series 10: FLAIR · axial · 4.0mm · 0.72mm/px · 1 of 30 slices shown]
[im 1/30]
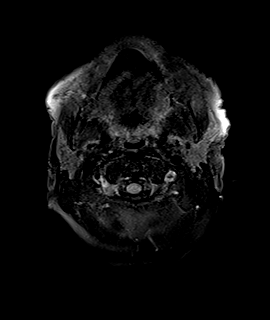

[Series 11: GRE · axial · 4.0mm · 0.45mm/px · z∈[-98,+40]mm · 2 of 30 slices shown]
[im 1/30]
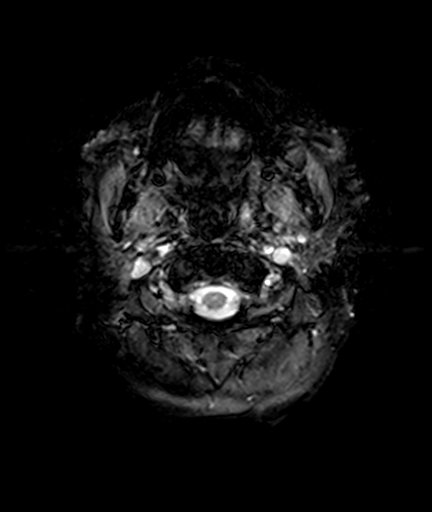
[im 30/30]
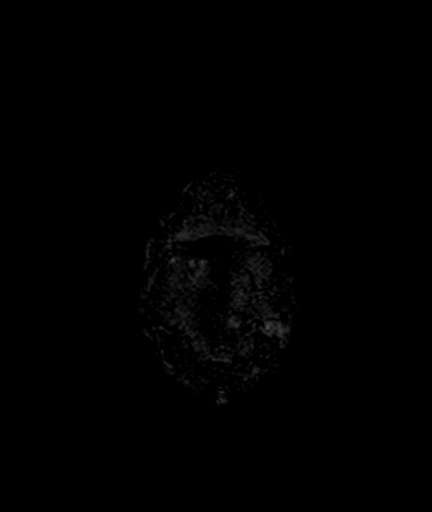

[Series 12: T2 fat-sat · axial · 4.0mm · 0.51mm/px · z∈[-98,+40]mm · 2 of 30 slices shown]
[im 1/30]
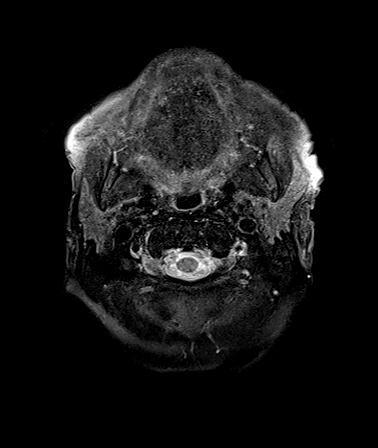
[im 30/30]
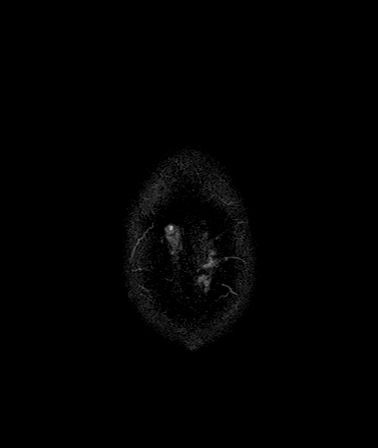

[Series 13: T1 post-contrast · axial · 4.0mm · 0.72mm/px · z∈[-98,+40]mm · 2 of 30 slices shown (1 of 2)]
[im 1/30]
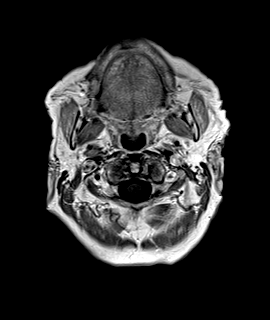
[im 30/30]
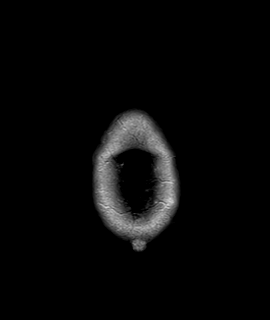

[Series 14: T1 post-contrast · axial · 1.0mm · 0.97mm/px · z∈[-109,+48]mm · 9 of 159 slices shown (2 of 2)]
[im 1/159]
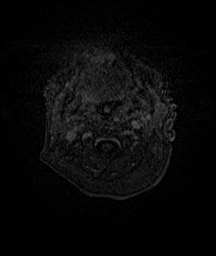
[im 20/159]
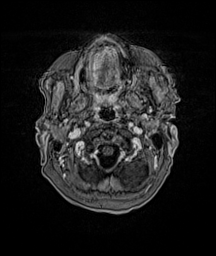
[im 40/159]
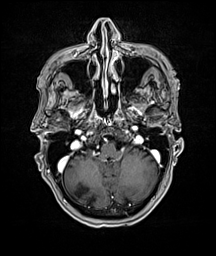
[im 60/159]
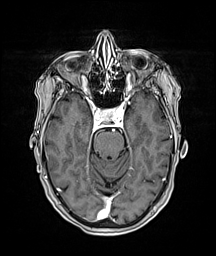
[im 80/159]
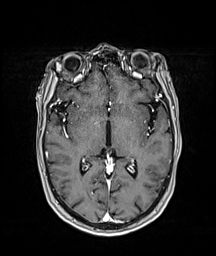
[im 99/159]
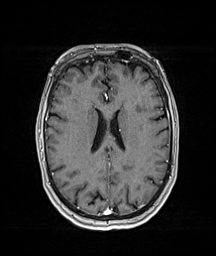
[im 119/159]
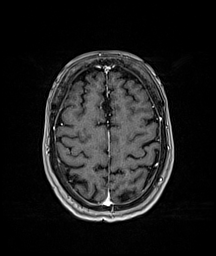
[im 139/159]
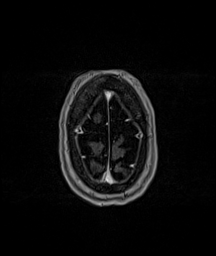
[im 159/159]
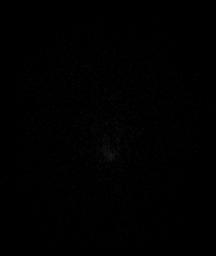

[Series 15: T1 · sagittal · 1.0mm · 0.97mm/px · 11 of 199 slices shown (3 of 4)]
[im 1/199]
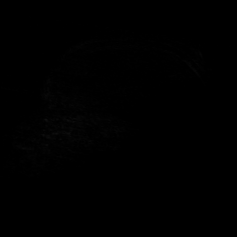
[im 20/199]
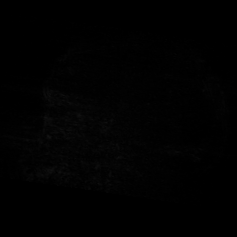
[im 40/199]
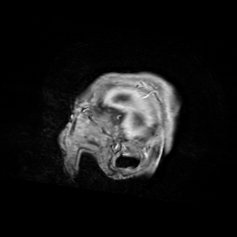
[im 60/199]
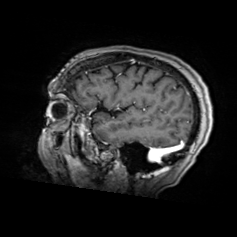
[im 80/199]
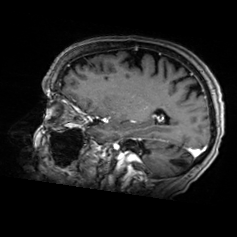
[im 100/199]
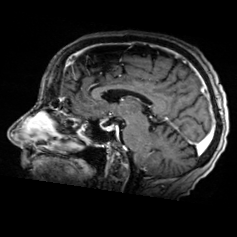
[im 119/199]
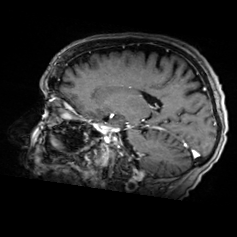
[im 139/199]
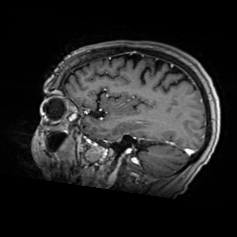
[im 159/199]
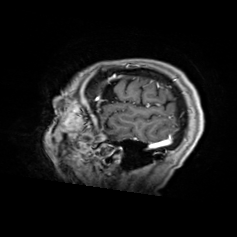
[im 179/199]
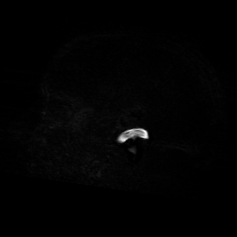
[im 199/199]
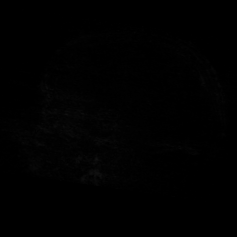

[Series 16: T1 · coronal · 1.0mm · 0.97mm/px · 13 of 228 slices shown (4 of 4)]
[im 1/228]
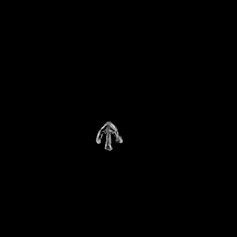
[im 19/228]
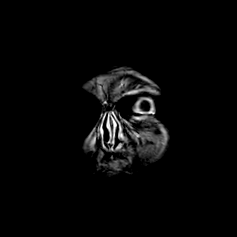
[im 38/228]
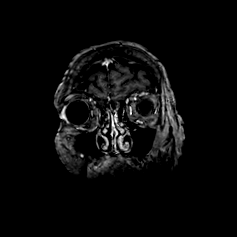
[im 57/228]
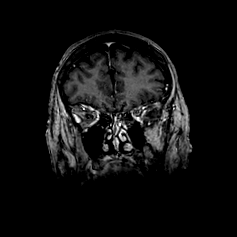
[im 76/228]
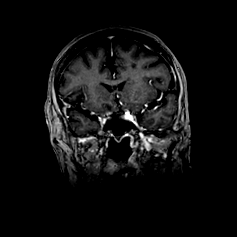
[im 95/228]
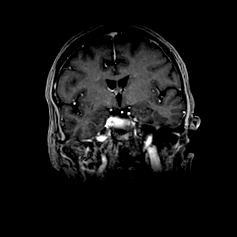
[im 114/228]
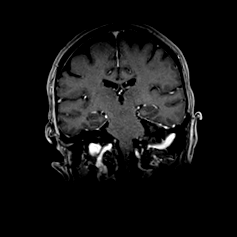
[im 133/228]
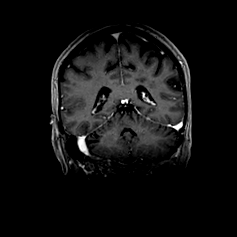
[im 152/228]
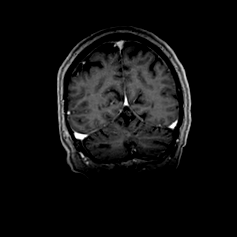
[im 171/228]
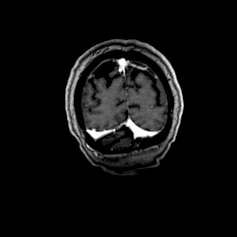
[im 190/228]
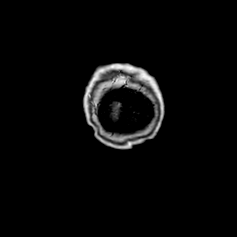
[im 209/228]
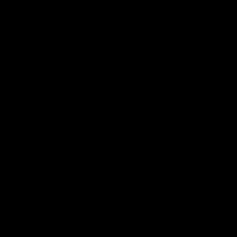
[im 228/228]
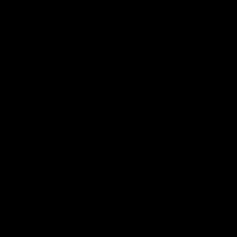

[48 of 48 positions shown; findings below may reference images not displayed]

FINDINGS: No abnormal areas of restricted diffusion. There is mild diffuse cerebral and cerebellar volume loss with chronic microangiopathic changes. There are several small remote deep white matter infarcts. Ventricles within normal size limits.
There is a small remote infarct in the right cerebral hemisphere. Sella unremarkable. The imaged paranasal sinuses and mastoid air cells are predominately clear. There are grossly normal flow voids in the major intracranial vessels. Postcontrast enhanced images demonstrates no abnormal parenchymal, dural, or leptomeningeal enhancement.
IMPRESSION: No findings of intracranial metastatic disease. No acute intracranial process.

## 2023-12-13 IMAGING — CT CT ABDOMEN PELVIS WITHOUT CONTRAST
2 of 4 series · 16 of 46 positions shown, 18 images · non-contrast
Comparison: 11/12/2023 CT chest an CT abdomen pelvis 05/20/2010

FINAL REPORT:
CT ABDOMEN PELVIS WITHOUT CONTRAST
Nausea/vomiting
INDICATION: CLINICAL HISTORY: Nausea/vomiting.
TECHNIQUE: CT was performed from the lung apices through the pelvis glands
without IV contrast
Sagittal and coronal planes using lung and soft tissue algorithms. Dose
reduction measures were considered and/or implemented.
Unless the patient's specific circumstances suggest otherwise, any liver
lesion 0.5 cm or less, any cystic kidney lesion less than 1.0 cm, and/or
any adrenal lesion 1.0 cm or less not otherwise characterized in this
report as processing suspicious or indeterminate imaging features is/are
most likely to be benign and do not require follow-up imaging or biopsy.

[Series 3: thins · axial · 0.71mm/px · z∈[-415,-15]mm · 13 of 696 slices shown, 15 images]
[im 28/696  soft-tissue]
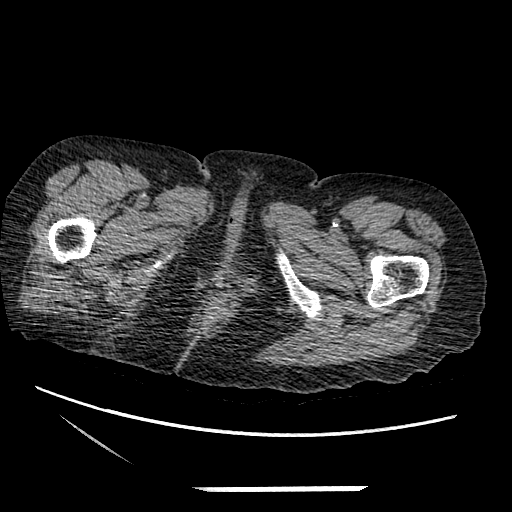
[im 28/696  bone]
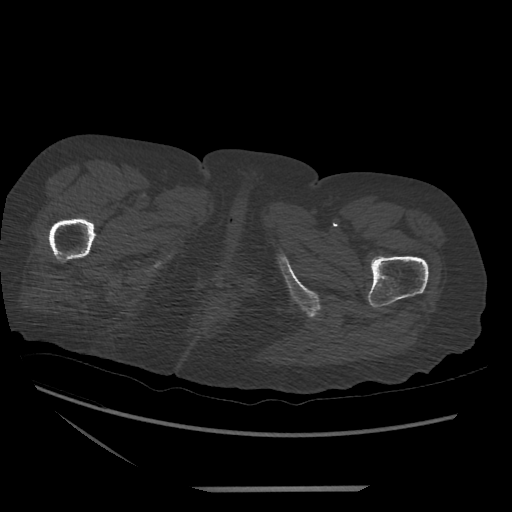
[im 84/696  soft-tissue]
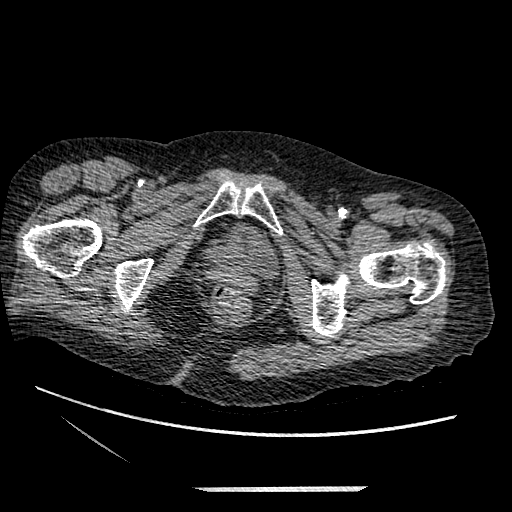
[im 140/696  soft-tissue]
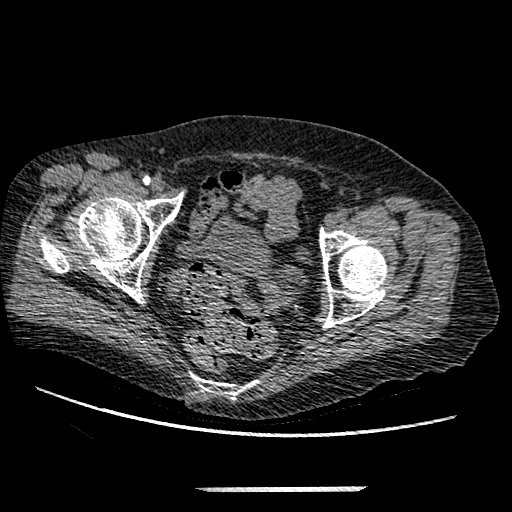
[im 195/696  soft-tissue]
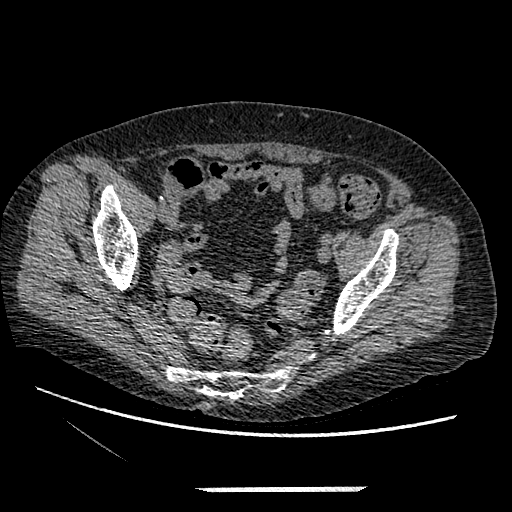
[im 251/696  soft-tissue]
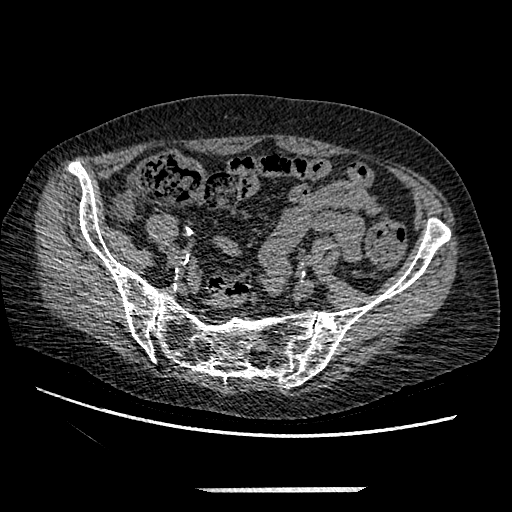
[im 306/696  soft-tissue]
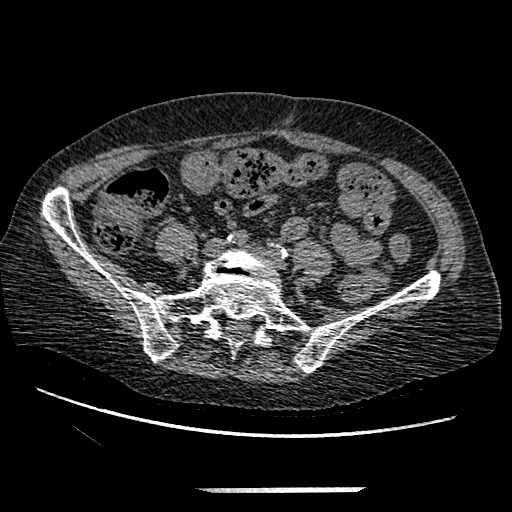
[im 362/696  soft-tissue]
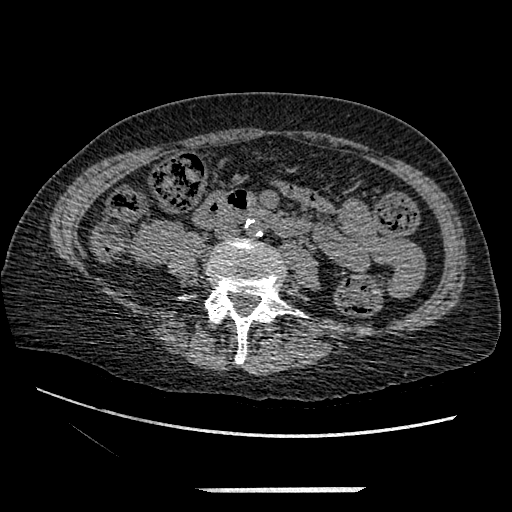
[im 390/696  soft-tissue]
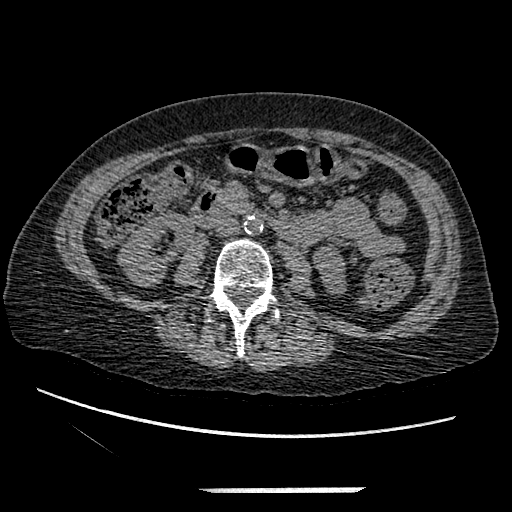
[im 445/696  soft-tissue]
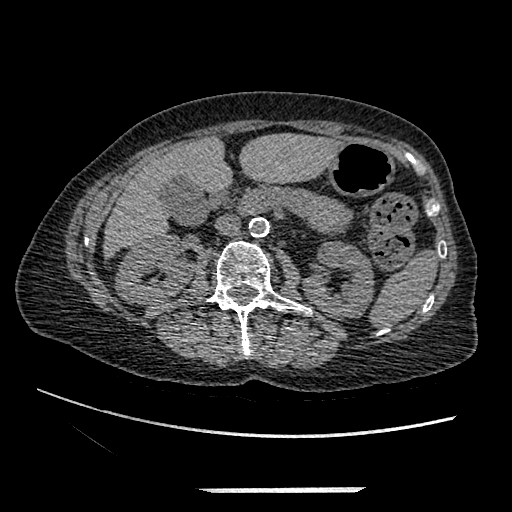
[im 445/696  bone]
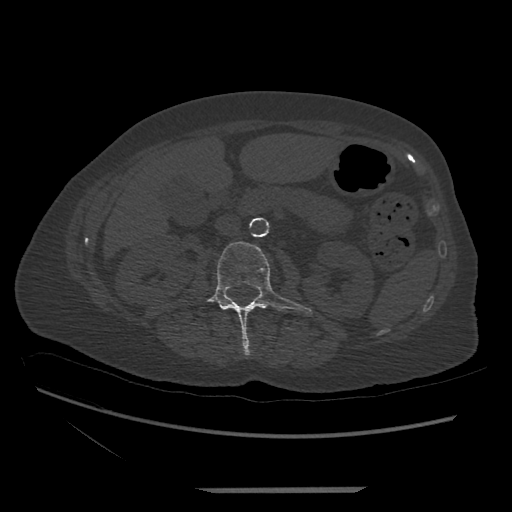
[im 501/696  soft-tissue]
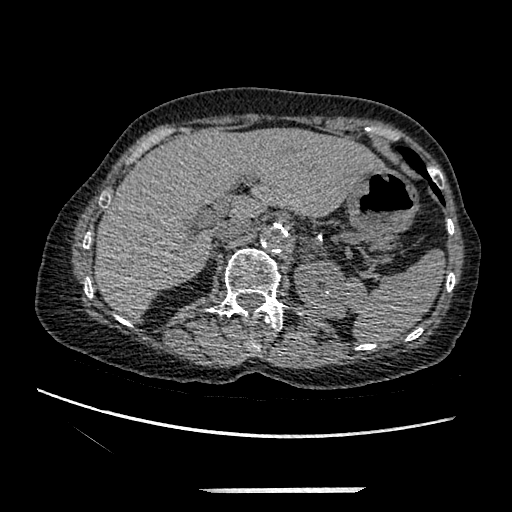
[im 557/696  soft-tissue]
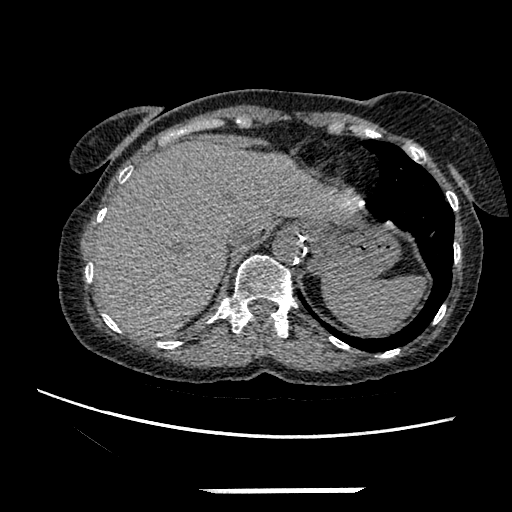
[im 612/696  soft-tissue]
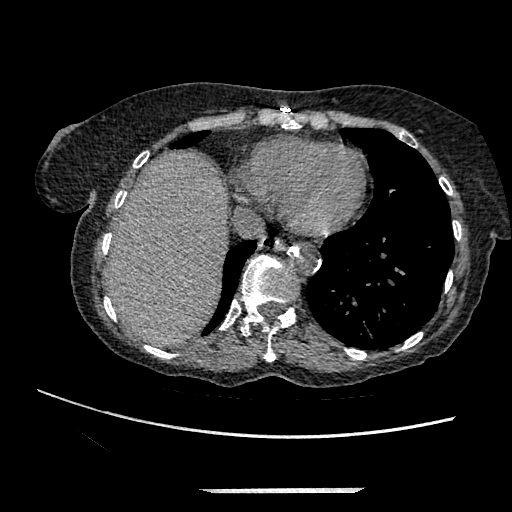
[im 668/696  soft-tissue]
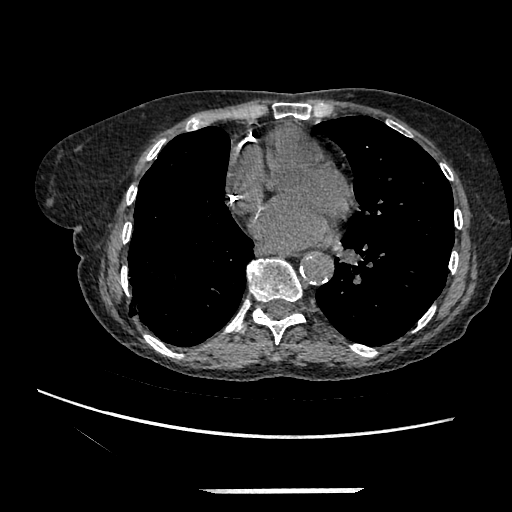

[Series 602: sag standard 2x2 · sagittal · 0.85mm/px · 3 of 183 slices shown]
[im 61/183  soft-tissue]
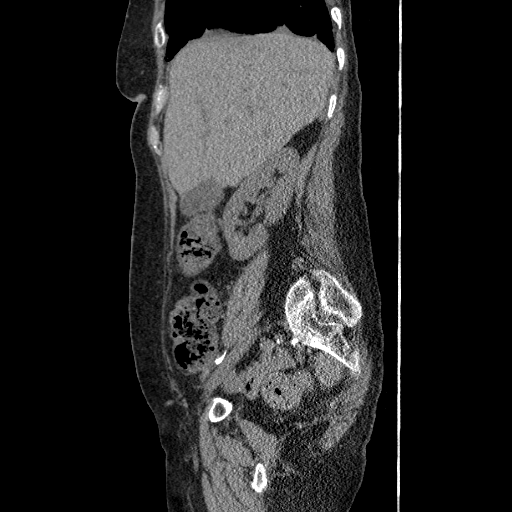
[im 81/183  soft-tissue]
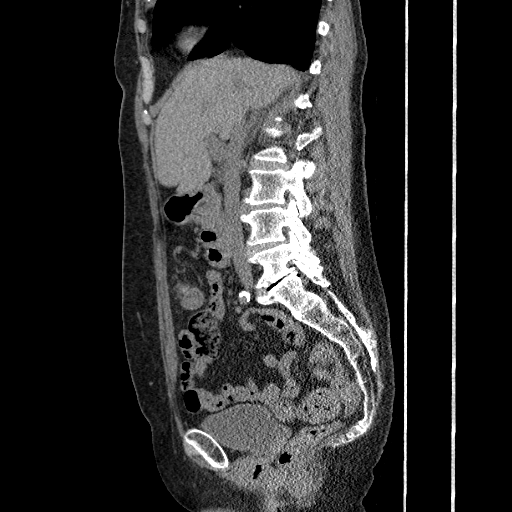
[im 102/183  soft-tissue]
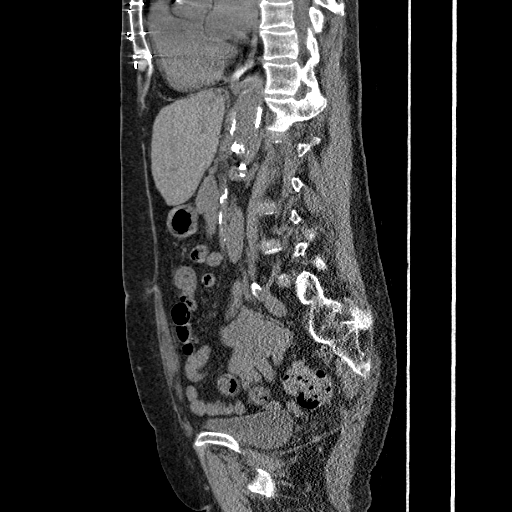

[16 of 46 positions shown; findings below may reference images not displayed]

FINDINGS: LUNGS AND AIRWAYS: There is slight increase in size of a cyst right lung base
now measuring 2.2 cm previously 8.8 mm. There is stable groundglass in the
dependent lungs bilaterally. There are coronary calcifications. There is a small
hiatal hernia.
CT Abdomen:
Liver: Liver is normal in density without intrahepatic ductal dilation.
Gallbladder:   Normal
Pancreas: The pancreas is normal in density without ductal dilation or mass
lesions.
Spleen: The spleen is normal in size and density.
Adrenal: There is stable prominence of the left adrenal gland. Right adrenal
gland is unremarkable.
Kidneys: The kidneys are symmetric in size without hydronephrosis. There is a
punctate nonobstructive left renal calcification.
Bowel:   Stomach and small bowel are unremarkable. There is a focal area of wall
thickening within the ascending colon with mild adjacent inflammatory changes
best seen on axial images 81-88. There is fecalization of the terminal ileum.
There is moderate stool burden seen throughout the remainder of the colon.
CT pelvis:
Bladder: Bladder is decompressed.
Reproductive Organs: Patient status post hysterectomy.
Lymph Nodes: No significant mesenteric, retroperitoneal, pelvic or inguinal
lymphadenopathy is appreciated
Other findings:None
Bony Structures: No aggressive bone lesions.
IMPRESSION: 1.  Focal area of wall thickening within the ascending colon with mild adjacent
inflammatory changes. This may represent a focal colitis or underlying mass.
Correlate with colonoscopy.
2.  Fecalization of the terminal ileum suggests slow transit.
3.  Constipation
4.  Punctate nonobstructive left renal calcification.
12/13/2023 [DATE] PMFrom Workstation ID: [id number]

## 2023-12-13 IMAGING — CT CT ABDOMEN PELVIS WITH CONTRAST
2 of 4 series · 16 of 46 positions shown, 18 images · IV contrast (agent unspecified)
Comparison: CT/SR - CT ABDOMEN PELVIS WITHOUT CONTRAST - 12/13/23 [DATE] LABELLE SALHA

FINAL REPORT:
EXAM:
CT Abdomen and Pelvis with IV contrast
CLINICAL HISTORY: Abdominal pain, acute, nonlocalized; Noninfective gastroenteritis and colitis, unspecified; Nausea with vomiting, unspecified
TECHNIQUE: Axial computed tomography images of the abdomen and pelvis with intravenous contrast.
All CT scans at this facility use dose modulation, iterative reconstruction, and/or weight-based dosing when appropriate to reduce radiation dose to as low as reasonably achievable.

[Series 3: thins · axial · 0.81mm/px · z∈[-386,+11]mm · 13 of 354 slices shown, 15 images]
[im 18/354  soft-tissue]
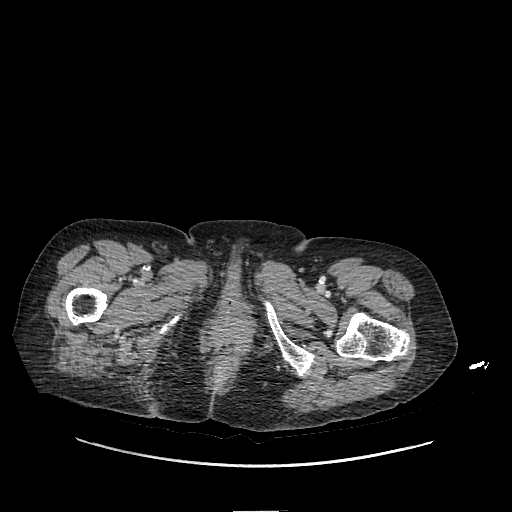
[im 18/354  bone]
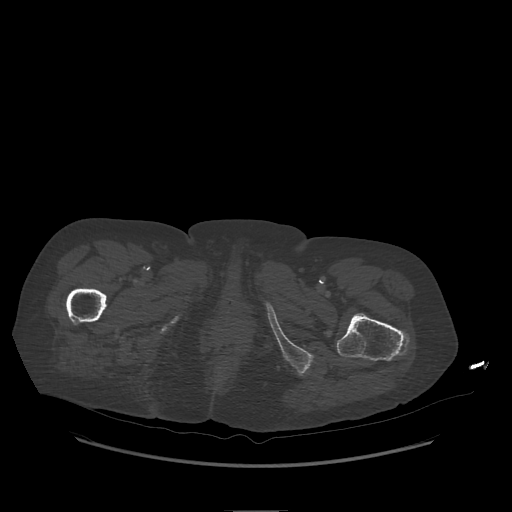
[im 53/354  soft-tissue]
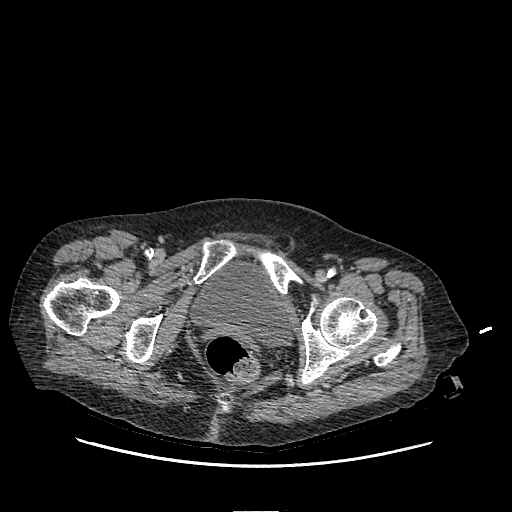
[im 71/354  soft-tissue]
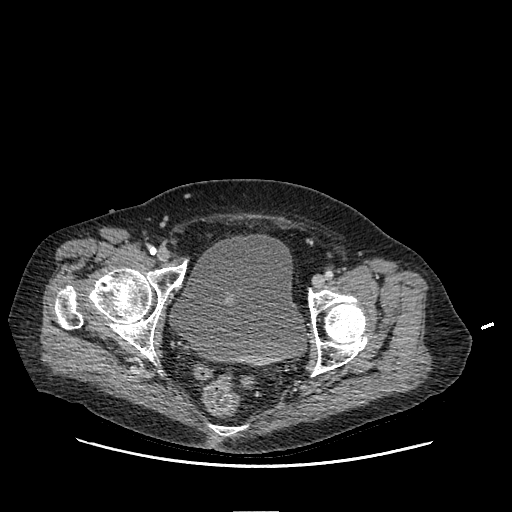
[im 106/354  soft-tissue]
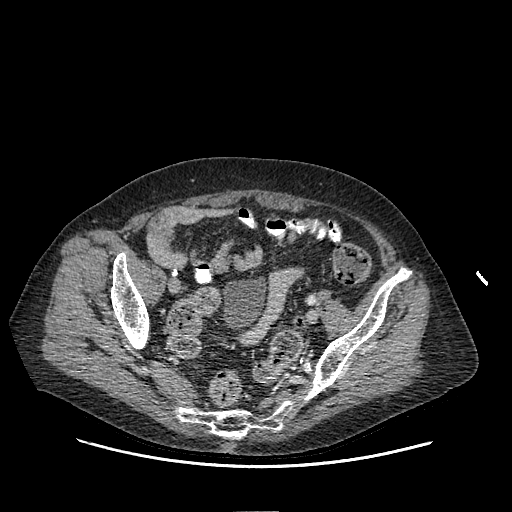
[im 124/354  soft-tissue]
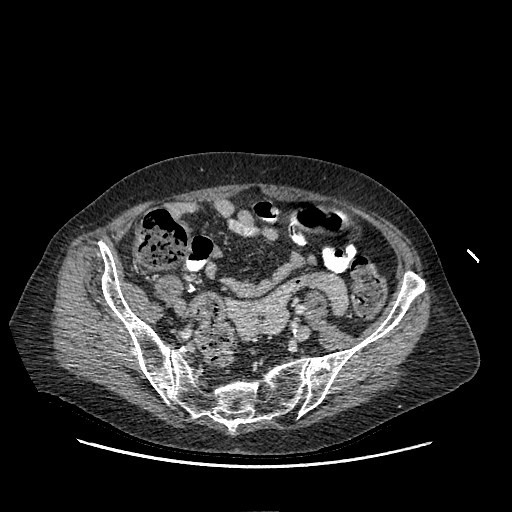
[im 159/354  soft-tissue]
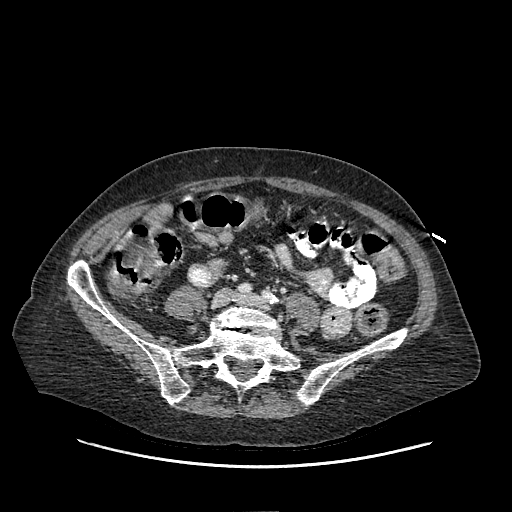
[im 177/354  soft-tissue]
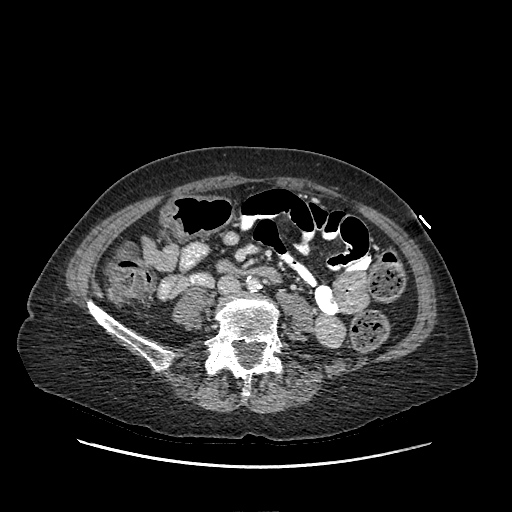
[im 195/354  soft-tissue]
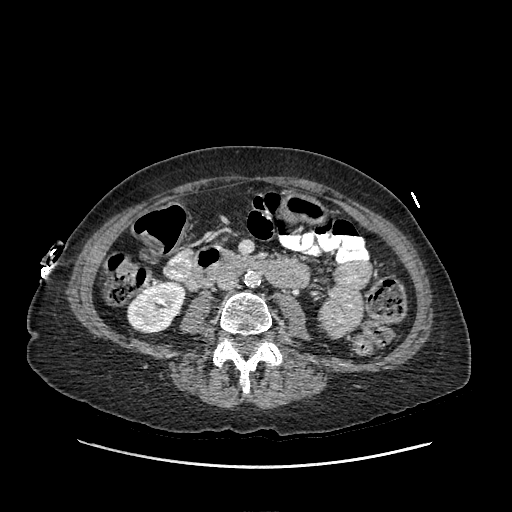
[im 230/354  soft-tissue]
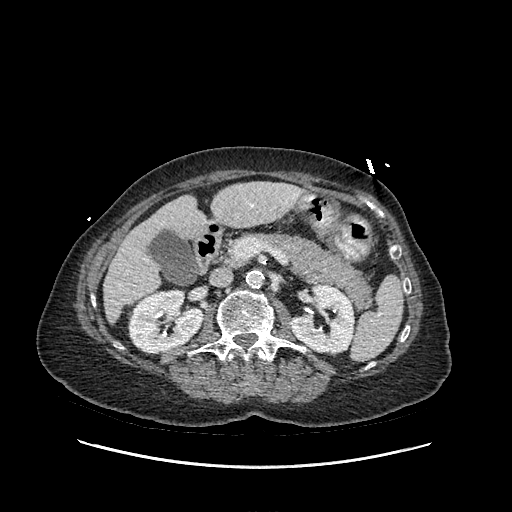
[im 230/354  bone]
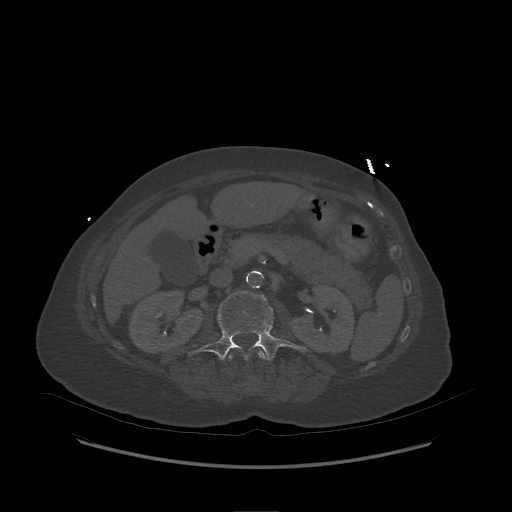
[im 248/354  soft-tissue]
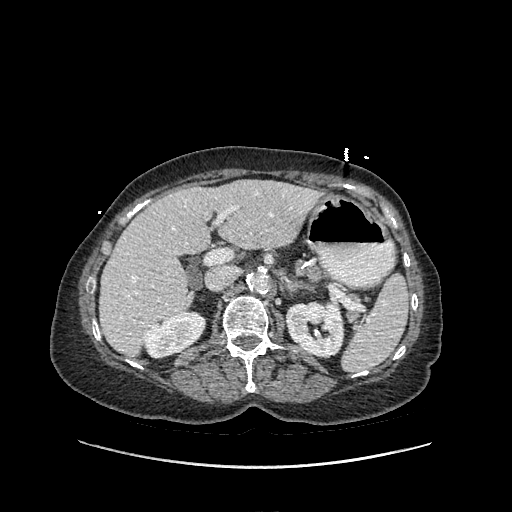
[im 283/354  soft-tissue]
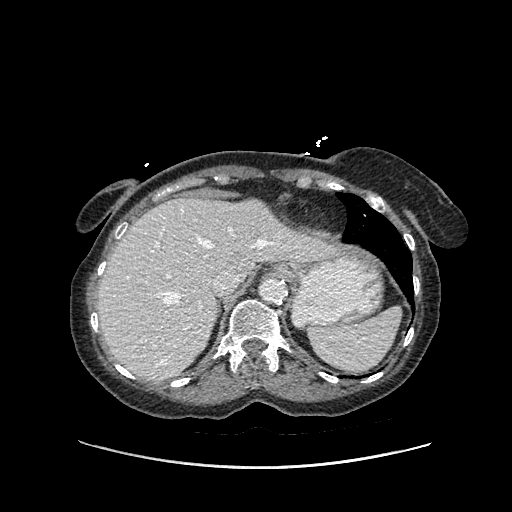
[im 301/354  soft-tissue]
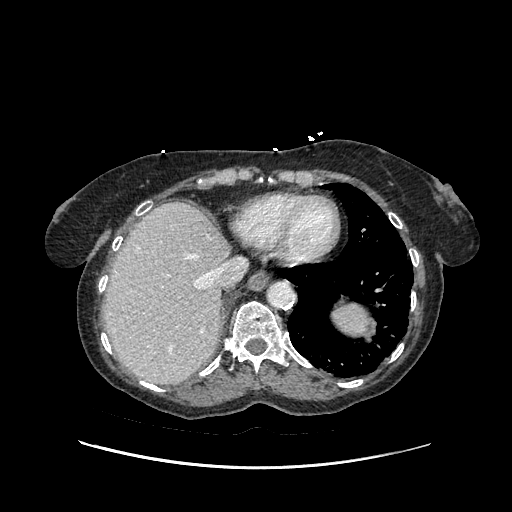
[im 336/354  soft-tissue]
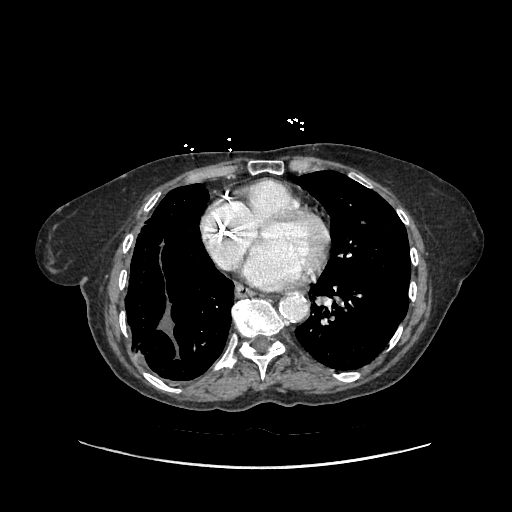

[Series 602: sag standard 2x2 · sagittal · 0.86mm/px · 3 of 194 slices shown]
[im 65/194  soft-tissue]
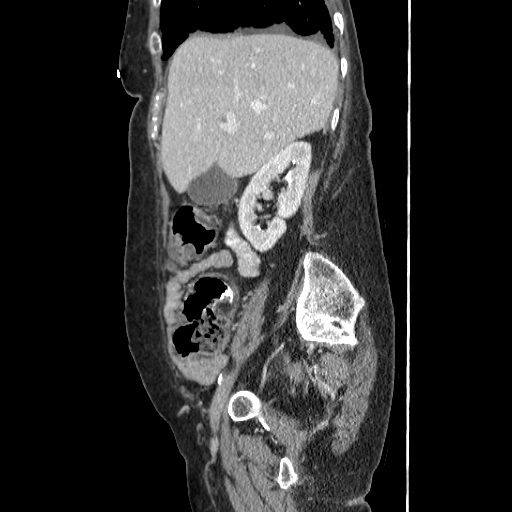
[im 86/194  soft-tissue]
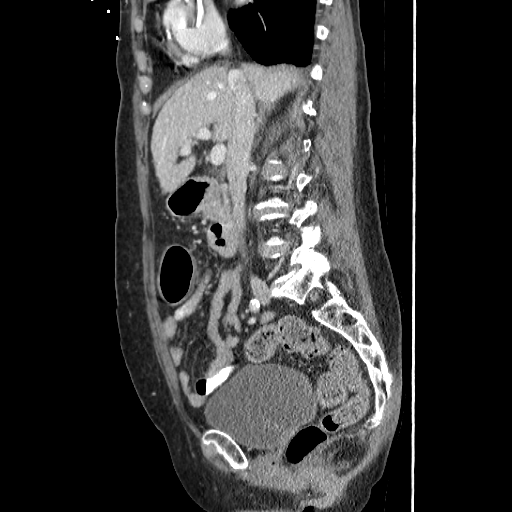
[im 108/194  soft-tissue]
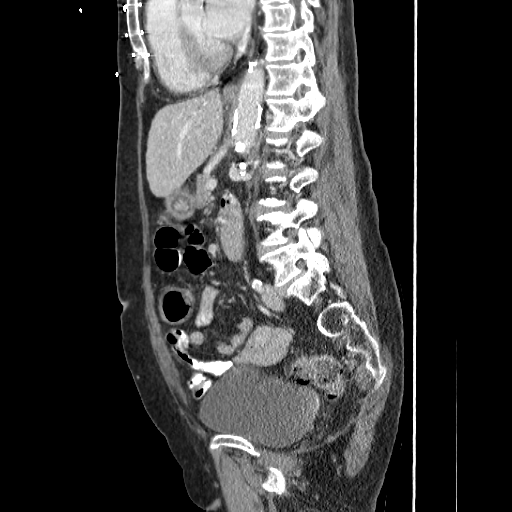

[16 of 46 positions shown; findings below may reference images not displayed]

FINDINGS: LUNG BASES:
The lung bases appear clear. No pleural effusions are seen.
LIVER:
Unremarkable.
GALLBLADDER AND BILE DUCTS:
The gallbladder appears within normal limits. No radioopaque gallstones are seen. No biliary ductal dilatation is evident.
PANCREAS:
10 mm hypodense mass within the junction of the pancreatic body and tail.
SPLEEN:
Unremarkable.
ADRENAL GLANDS:
Unremarkable.
KIDNEYS, URETERS, AND BLADDER:
No hydronephrosis. Contrast in the collecting systems limits evaluation for stone. Ill-defined hyperdensity in the bladder may reflect ureteral jets. If there is any history of hematuria would consider cystoscopy to exclude underlying mass.
Left renal cysts.
STOMACH AND BOWEL:
No bowel obstruction or inflammation. Contrast reaches the ileocecal junction moderate diffuse colonic stool burden.
APPENDIX:
Appendix not visualized however no evidence of acute appendicitis.
PERITONEUM:
No free fluid. No free air.
LYMPH NODES:
No lymphadenopathy is evident.
REPRODUCTIVE:
Hysterectomy
VASCULATURE:
No evidence of abdominal aortic aneurysm.
BONES:
No aggressive appearing osseous lesion. No acute osseous pathology evident.
ABDOMINAL WALL:
Unremarkable.
IMPRESSION: 1. No acute inflammatory changes. No evidence of bowel obstruction.
2. Indeterminate 1 cm pancreatic mass which should be followed up with nonemergent multiphase MRI.
3. Additional findings as above.
